# Patient Record
Sex: Female | Born: 1970 | Race: White | Hispanic: No | Marital: Married | Smoking: Never smoker
Health system: Southern US, Community
[De-identification: ages and names within clinical notes are randomized; demographics above are authoritative.]

## PROBLEM LIST (undated history)

## (undated) DIAGNOSIS — E079 Disorder of thyroid, unspecified: Secondary | ICD-10-CM

## (undated) DIAGNOSIS — F410 Panic disorder [episodic paroxysmal anxiety] without agoraphobia: Secondary | ICD-10-CM

## (undated) DIAGNOSIS — K589 Irritable bowel syndrome without diarrhea: Secondary | ICD-10-CM

## (undated) DIAGNOSIS — F419 Anxiety disorder, unspecified: Secondary | ICD-10-CM

## (undated) DIAGNOSIS — N809 Endometriosis, unspecified: Secondary | ICD-10-CM

## (undated) DIAGNOSIS — N3010 Interstitial cystitis (chronic) without hematuria: Secondary | ICD-10-CM

## (undated) DIAGNOSIS — K219 Gastro-esophageal reflux disease without esophagitis: Secondary | ICD-10-CM

## (undated) DIAGNOSIS — M797 Fibromyalgia: Secondary | ICD-10-CM

## (undated) HISTORY — DX: Disorder of thyroid, unspecified: E07.9

## (undated) HISTORY — DX: Morbid (severe) obesity due to excess calories: E66.01

## (undated) HISTORY — DX: Fibromyalgia: M79.7

## (undated) HISTORY — DX: Panic disorder (episodic paroxysmal anxiety): F41.0

## (undated) HISTORY — DX: Anxiety disorder, unspecified: F41.9

## (undated) HISTORY — DX: Interstitial cystitis (chronic) without hematuria: N30.10

## (undated) HISTORY — DX: Irritable bowel syndrome, unspecified: K58.9

## (undated) HISTORY — DX: Gastro-esophageal reflux disease without esophagitis: K21.9

## (undated) HISTORY — PX: ENDOMETRIAL ABLATION: SHX621

## (undated) HISTORY — DX: Endometriosis, unspecified: N80.9

---

## 1997-04-20 ENCOUNTER — Observation Stay (HOSPITAL_COMMUNITY): Admission: AD | Admit: 1997-04-20 | Discharge: 1997-04-20 | Payer: Self-pay | Admitting: Family Medicine

## 1997-04-25 ENCOUNTER — Observation Stay (HOSPITAL_COMMUNITY): Admission: AD | Admit: 1997-04-25 | Discharge: 1997-04-26 | Payer: Self-pay | Admitting: Family Medicine

## 1999-03-07 ENCOUNTER — Other Ambulatory Visit: Admission: RE | Admit: 1999-03-07 | Discharge: 1999-03-07 | Payer: Self-pay | Admitting: Obstetrics & Gynecology

## 1999-09-01 ENCOUNTER — Ambulatory Visit (HOSPITAL_COMMUNITY): Admission: RE | Admit: 1999-09-01 | Discharge: 1999-09-01 | Payer: Self-pay | Admitting: Obstetrics & Gynecology

## 2000-03-05 ENCOUNTER — Other Ambulatory Visit: Admission: RE | Admit: 2000-03-05 | Discharge: 2000-03-05 | Payer: Self-pay | Admitting: Obstetrics & Gynecology

## 2001-04-09 ENCOUNTER — Other Ambulatory Visit: Admission: RE | Admit: 2001-04-09 | Discharge: 2001-04-09 | Payer: Self-pay | Admitting: Obstetrics & Gynecology

## 2002-06-07 ENCOUNTER — Inpatient Hospital Stay (HOSPITAL_COMMUNITY): Admission: AD | Admit: 2002-06-07 | Discharge: 2002-06-07 | Payer: Self-pay | Admitting: Obstetrics & Gynecology

## 2002-06-16 ENCOUNTER — Inpatient Hospital Stay (HOSPITAL_COMMUNITY): Admission: AD | Admit: 2002-06-16 | Discharge: 2002-06-18 | Payer: Self-pay | Admitting: Obstetrics and Gynecology

## 2002-07-15 ENCOUNTER — Other Ambulatory Visit: Admission: RE | Admit: 2002-07-15 | Discharge: 2002-07-15 | Payer: Self-pay | Admitting: Obstetrics & Gynecology

## 2003-09-22 ENCOUNTER — Other Ambulatory Visit: Admission: RE | Admit: 2003-09-22 | Discharge: 2003-09-22 | Payer: Self-pay | Admitting: Obstetrics & Gynecology

## 2004-04-08 ENCOUNTER — Emergency Department (HOSPITAL_COMMUNITY): Admission: EM | Admit: 2004-04-08 | Discharge: 2004-04-08 | Payer: Self-pay | Admitting: Emergency Medicine

## 2004-11-27 ENCOUNTER — Other Ambulatory Visit: Admission: RE | Admit: 2004-11-27 | Discharge: 2004-11-27 | Payer: Self-pay | Admitting: Obstetrics & Gynecology

## 2009-07-19 ENCOUNTER — Encounter: Admission: RE | Admit: 2009-07-19 | Discharge: 2009-07-19 | Payer: Self-pay | Admitting: Obstetrics & Gynecology

## 2009-11-20 ENCOUNTER — Emergency Department (HOSPITAL_BASED_OUTPATIENT_CLINIC_OR_DEPARTMENT_OTHER): Admission: EM | Admit: 2009-11-20 | Discharge: 2009-11-21 | Payer: Self-pay | Admitting: Emergency Medicine

## 2009-11-21 ENCOUNTER — Ambulatory Visit: Payer: Self-pay | Admitting: Diagnostic Radiology

## 2010-07-20 NOTE — Op Note (Signed)
[facility]  Patient:    Adrienne Summers, Adrienne Summers                    MRN: [id number] Proc. Date: 09/01/99 Adm. Date:  [id number] Attending:  Minette Headland                           Operative Report  PREOPERATIVE DIAGNOSES:       Chronic pelvic pain, probable recurrent                               endometriosis.  POSTOPERATIVE DIAGNOSES:      Recurrent endometriosis and pelvic adhesions.  OPERATIVE PROCEDURE:          Lysis of pelvic adhesions, fulguration of endometriosis.  SURGEON:                      Freddy Finner, M.D.  ANESTHESIA:                   General endotracheal.  INTRAOPERATIVE COMPLICATIONS: None.  ESTIMATED BLOOD LOSS:         Less than or equal to 10 cc.  INDICATIONS:                  The patient is a 40 year old who was found to have pelvic endometriosis treated by laser in 1997.  She had urterosacral nerve ablation at time. Over the past six to 12 months, she has had recurrence of her symptoms.  She is now admitted for repeat laparoscopy.  FINDINGS:                     Adhesions from the right ovary to the right pelvic sidewall, also from the left ovary to the left pelvic sidewall, from the sigmoid colon to the left lateral pelvic peritoneum.  There was scattered endometriotic lesions, some probably in the base of the adhesions to the right pelvic sidewall, one along the left pelvic sidewall, and one along the uterosacral ligament, one in the cul-de-sac.  DESCRIPTION OF PROCEDURE:     The patient was admitted on the morning of surgery and brought to the operating room and placed under adequate general endotracheal anesthesia and placed in the dorsolithotomy position using Allen stirrups.  Hibiclens of the abdomen, perineum, and vagina was carried out. The bladder was evacuated with a Robinson catheter.  The cervix was visualized the Hulka tenaculum attached without difficulty.  Sterile drapes were applied. Two incisions  were made through all the scars, one at the umbilicus and just above the symphysis.  A 10 mm trocar was introduced through the upper incision incision while elevating the anterior abdominal wall manually.  Direct inspection revealed adequate placement and no evidence of injury on entry. Pneumoperitoneum was allowed to accumulate.  Appendix was visualized and was normal.  Pelvic contents were carefully inspected with findings as noted above.  Using the reticulated endoshears through the operating channel of the laparoscope, the adhesions were lysed.  The bipolar forceps were then used to fulgurate all visible evidence of endometriosis and fulgurate along the margins of the adhesion lysis on the right side.  There was also a lesion consistent with endometriosis on the right ovary which was fulguration. Irrigating solution was used, lactated Ringers through ___ probe. Irrigation was carried out and hemostasis was complete.  Irrigating  solution was aspirated, gas was allowed to escape from the abdomen.  All instruments were removed.  Marcaine 0.50% was injected in the incision sites for postoperative analgesia.  The incisions were closed with interrupted subcuticular sutures of 3-0 Dexon.  The patient tolerated the operative procedure well.  She has Darvocet she will take for postoperative pain.  She was taken to the recovery room in good condition and will be given routine outpatient surgical instructions. DD:  09/01/99 TD:  09/01/99 Job: [id number]

## 2010-11-06 ENCOUNTER — Other Ambulatory Visit: Payer: Self-pay | Admitting: Obstetrics & Gynecology

## 2010-11-06 DIAGNOSIS — N63 Unspecified lump in unspecified breast: Secondary | ICD-10-CM

## 2010-11-08 ENCOUNTER — Ambulatory Visit
Admission: RE | Admit: 2010-11-08 | Discharge: 2010-11-08 | Disposition: A | Discharge status: Home / Self Care | Payer: BC Managed Care – PPO | Source: Ambulatory Visit | Attending: Obstetrics & Gynecology | Admitting: Obstetrics & Gynecology

## 2010-11-08 DIAGNOSIS — N63 Unspecified lump in unspecified breast: Secondary | ICD-10-CM

## 2015-04-27 ENCOUNTER — Ambulatory Visit: Payer: Self-pay

## 2015-04-27 ENCOUNTER — Encounter: Payer: Self-pay | Admitting: Podiatry

## 2015-04-27 ENCOUNTER — Ambulatory Visit (INDEPENDENT_AMBULATORY_CARE_PROVIDER_SITE_OTHER): Payer: 59 | Admitting: Podiatry

## 2015-04-27 VITALS — BP 131/86 | HR 85 | Resp 16 | Ht 63.0 in | Wt 257.0 lb

## 2015-04-27 DIAGNOSIS — L600 Ingrowing nail: Secondary | ICD-10-CM

## 2015-04-27 DIAGNOSIS — M722 Plantar fascial fibromatosis: Secondary | ICD-10-CM | POA: Diagnosis not present

## 2015-04-27 MED ORDER — TRIAMCINOLONE ACETONIDE 10 MG/ML IJ SUSP
10.0000 mg | Freq: Once | INTRAMUSCULAR | Status: AC
  Administered 2015-04-27: 10 mg

## 2015-04-27 NOTE — Progress Notes (Signed)
   Subjective:    Patient ID: Adrienne Summers, female    DOB: [birth year], 45 y.o.   MRN: [id number]  HPI Patient presents with a nail problem on their Left foot; great toe-lateral; x1 month.   Review of Systems  Constitutional: Positive for fatigue.  HENT: Positive for sinus pressure.   All other systems reviewed and are negative.      Objective:   Physical Exam        Assessment & Plan:

## 2015-04-27 NOTE — Patient Instructions (Signed)

## 2015-04-28 NOTE — Progress Notes (Signed)
Subjective:     Patient ID: Adrienne Summers, female   DOB: [birth year], 45 y.o.   MRN: [id number]  HPI neurovascular status intact muscle strength adequate with patient found to have incurvated left hallux lateral border that's painful when pressed and moderate discomfort in the plantar heel. States the heels bothering her for a while and the nails been giving her trouble for around a month   Review of Systems  All other systems reviewed and are negative.      Objective:   Physical Exam  Constitutional: She is oriented to person, place, and time.  Cardiovascular: Intact distal pulses.   Musculoskeletal: Normal range of motion.  Neurological: She is oriented to person, place, and time.  Skin: Skin is warm.  Nursing note and vitals reviewed.  neurovascular status found to be intact muscle strength adequate range of motion within normal limits with patient noted to have incurvated left hallux lateral border that's painful when pressed with also patient noted to have a pain in the left heel at the insertional point tendon calcaneus. I noted distal redness in the toe and patient was not noted to have drainage and has good digital perfusion and is well oriented 3     Assessment:     Plantar fasciitis left with ingrown toenail deformity left hallux lateral border    Plan:     H&P and x-rays reviewed with patient. Today I went ahead and I recommended correction of the nail and I explained procedure and risk and patient wants procedure. I infiltrated the left hallux 60 mg Xylocaine Marcaine mixture remove the lateral border exposed matrix and applied phenol 3 applications 30 seconds followed by alcohol lavage and sterile dressing. Gave instructions on soaks and reappoint and also went ahead and injected the left plantar fashion 3 mg Kenalog 5 mg Xylocaine and instructed on physical therapy and reappoint 2 weeks  X-ray report indicated small spur with no indications of stress fracture or  arthritis

## 2015-05-04 ENCOUNTER — Telehealth: Payer: Self-pay | Admitting: *Deleted

## 2015-05-04 NOTE — Telephone Encounter (Signed)
Called patient at [phone] (Cell #) to check to see how they were doing from their ingrown toenail procedure that was performed on Thursday, April 27, 2015. Pt stated, "Doing good and has no pain'.

## 2015-05-11 ENCOUNTER — Ambulatory Visit (INDEPENDENT_AMBULATORY_CARE_PROVIDER_SITE_OTHER): Payer: 59 | Admitting: Podiatry

## 2015-05-11 DIAGNOSIS — M722 Plantar fascial fibromatosis: Secondary | ICD-10-CM

## 2015-05-13 NOTE — Progress Notes (Signed)
Subjective:     Patient ID: Adrienne Summers, female   DOB: 07-May-1970, 45 y.o.   MRN: [id number]  HPI patient presents stating I am doing a lot better with my pain   Review of Systems     Objective:   Physical Exam Neurovascular status intact muscle strength adequate with diminished discomfort plantar right foot with fluid still present upon deep palpation but improved on discomfort    Assessment:     Improving tendinitis-like condition right    Plan:     Discussed the importance of physical therapy anti-inflammatories supportive shoes and not going barefoot. If symptoms remain minimal patient will not be seen back and less needed

## 2015-07-24 ENCOUNTER — Ambulatory Visit: Payer: 59 | Admitting: Podiatry

## 2016-01-09 ENCOUNTER — Other Ambulatory Visit: Payer: Self-pay | Admitting: Obstetrics & Gynecology

## 2016-01-09 DIAGNOSIS — Z1231 Encounter for screening mammogram for malignant neoplasm of breast: Secondary | ICD-10-CM

## 2016-02-13 ENCOUNTER — Ambulatory Visit
Admission: RE | Admit: 2016-02-13 | Discharge: 2016-02-13 | Disposition: A | Discharge status: Home / Self Care | Payer: 59 | Source: Ambulatory Visit | Attending: Obstetrics & Gynecology | Admitting: Obstetrics & Gynecology

## 2016-02-13 DIAGNOSIS — Z1231 Encounter for screening mammogram for malignant neoplasm of breast: Secondary | ICD-10-CM

## 2016-02-15 ENCOUNTER — Other Ambulatory Visit: Payer: Self-pay | Admitting: Obstetrics & Gynecology

## 2016-02-15 DIAGNOSIS — R928 Other abnormal and inconclusive findings on diagnostic imaging of breast: Secondary | ICD-10-CM

## 2016-02-19 ENCOUNTER — Ambulatory Visit
Admission: RE | Admit: 2016-02-19 | Discharge: 2016-02-19 | Disposition: A | Discharge status: Home / Self Care | Payer: 59 | Source: Ambulatory Visit | Attending: Obstetrics & Gynecology | Admitting: Obstetrics & Gynecology

## 2016-02-19 DIAGNOSIS — R928 Other abnormal and inconclusive findings on diagnostic imaging of breast: Secondary | ICD-10-CM

## 2016-09-26 ENCOUNTER — Other Ambulatory Visit: Payer: Self-pay | Admitting: Obstetrics & Gynecology

## 2016-09-26 DIAGNOSIS — N6320 Unspecified lump in the left breast, unspecified quadrant: Secondary | ICD-10-CM

## 2016-10-02 ENCOUNTER — Other Ambulatory Visit: Payer: Self-pay | Admitting: Obstetrics & Gynecology

## 2016-10-02 ENCOUNTER — Ambulatory Visit
Admission: RE | Admit: 2016-10-02 | Discharge: 2016-10-02 | Disposition: A | Discharge status: Home / Self Care | Payer: 59 | Source: Ambulatory Visit | Attending: Obstetrics & Gynecology | Admitting: Obstetrics & Gynecology

## 2016-10-02 DIAGNOSIS — N6320 Unspecified lump in the left breast, unspecified quadrant: Secondary | ICD-10-CM

## 2016-10-02 DIAGNOSIS — N63 Unspecified lump in unspecified breast: Secondary | ICD-10-CM

## 2017-04-07 ENCOUNTER — Other Ambulatory Visit: Payer: 59

## 2017-04-23 ENCOUNTER — Ambulatory Visit: Payer: 59

## 2017-04-23 ENCOUNTER — Ambulatory Visit
Admission: RE | Admit: 2017-04-23 | Discharge: 2017-04-23 | Disposition: A | Discharge status: Home / Self Care | Payer: 59 | Source: Ambulatory Visit | Attending: Obstetrics & Gynecology | Admitting: Obstetrics & Gynecology

## 2017-04-23 DIAGNOSIS — N63 Unspecified lump in unspecified breast: Secondary | ICD-10-CM

## 2018-04-06 ENCOUNTER — Other Ambulatory Visit: Payer: Self-pay | Admitting: Obstetrics & Gynecology

## 2018-04-06 DIAGNOSIS — Z1231 Encounter for screening mammogram for malignant neoplasm of breast: Secondary | ICD-10-CM

## 2018-04-13 ENCOUNTER — Other Ambulatory Visit: Payer: Self-pay

## 2018-04-13 ENCOUNTER — Ambulatory Visit: Payer: 59 | Admitting: Family Medicine

## 2018-04-13 ENCOUNTER — Encounter: Payer: Self-pay | Admitting: Family Medicine

## 2018-04-13 VITALS — BP 130/88 | HR 99 | Temp 98.7°F | Resp 14 | Ht 63.0 in | Wt 255.0 lb

## 2018-04-13 DIAGNOSIS — M797 Fibromyalgia: Secondary | ICD-10-CM | POA: Diagnosis not present

## 2018-04-13 DIAGNOSIS — E038 Other specified hypothyroidism: Secondary | ICD-10-CM | POA: Insufficient documentation

## 2018-04-13 DIAGNOSIS — L719 Rosacea, unspecified: Secondary | ICD-10-CM

## 2018-04-13 DIAGNOSIS — K219 Gastro-esophageal reflux disease without esophagitis: Secondary | ICD-10-CM | POA: Diagnosis not present

## 2018-04-13 DIAGNOSIS — F410 Panic disorder [episodic paroxysmal anxiety] without agoraphobia: Secondary | ICD-10-CM

## 2018-04-13 DIAGNOSIS — K589 Irritable bowel syndrome without diarrhea: Secondary | ICD-10-CM | POA: Insufficient documentation

## 2018-04-13 DIAGNOSIS — E039 Hypothyroidism, unspecified: Secondary | ICD-10-CM | POA: Insufficient documentation

## 2018-04-13 DIAGNOSIS — N3010 Interstitial cystitis (chronic) without hematuria: Secondary | ICD-10-CM | POA: Insufficient documentation

## 2018-04-13 HISTORY — DX: Gastro-esophageal reflux disease without esophagitis: K21.9

## 2018-04-13 HISTORY — DX: Morbid (severe) obesity due to excess calories: E66.01

## 2018-04-13 HISTORY — DX: Interstitial cystitis (chronic) without hematuria: N30.10

## 2018-04-13 HISTORY — DX: Panic disorder (episodic paroxysmal anxiety): F41.0

## 2018-04-13 MED ORDER — SERTRALINE HCL 25 MG PO TABS: ORAL_TABLET | ORAL | 0 refills | Status: DC

## 2018-04-13 MED ORDER — METRONIDAZOLE 0.75 % EX CREA: TOPICAL_CREAM | Freq: Two times a day (BID) | CUTANEOUS | 0 refills | Status: DC

## 2018-04-13 NOTE — Patient Instructions (Signed)
Please return in 2-4 weeks for your annual complete physical; please come fasting.  I've restarted your zoloft.  I have sent in a cream for your face to use twice a day.   It was a pleasure meeting you today! Thank you for choosing [place] to meet your healthcare needs! I truly look forward to working with you. If you have any questions or concerns, please send me a message via Mychart or call the office at [phone].   Rosacea Rosacea is a long-term (chronic) condition that affects the skin of the face, including the cheeks, nose, forehead, and chin. This condition can also affect the eyes. Rosacea causes blood vessels near the surface of the skin to enlarge, which results in redness. What are the causes? The cause of this condition is not known. Certain triggers can make rosacea worse, including:  Hot baths.  Exercise.  Sunlight.  Very hot or cold temperatures.  Hot or spicy foods and drinks.  Drinking alcohol.  Stress.  Taking blood pressure medicine.  Long-term use of topical steroids on the face. What increases the risk? You are more likely to develop this condition if you:  Are older than 46 years of age.  Are a woman.  Have light-colored skin (light complexion).  Have a family history of rosacea. What are the signs or symptoms? Symptoms of this condition include:  Redness of the face.  Red bumps or pimples on the face.  A red, enlarged nose.  Blushing easily.  Red lines on the skin.  Irritated, burning, or itchy feeling in the eyes.  Swollen eyelids.  Drainage from the eyes.  Feeling like there is something in your eye. How is this diagnosed? This condition is diagnosed with a medical history and physical exam. How is this treated? There is no cure for this condition, but treatment can help to control your symptoms. Your health care provider may recommend that you see a skin specialist (dermatologist). Treatment may include:  Medicines that are  applied to the skin or taken by mouth (orally). This can include antibiotic medicines.  Laser treatment to improve the appearance of the skin.  Surgery. This is rare. Your health care provider will also recommend the best way to take care of your skin. Even after your skin improves, you will likely need to continue treatment to prevent your rosacea from coming back. Follow these instructions at home: Skin care Take care of your skin as told by your health care provider. You may be told to do these things:  Wash your skin gently two or more times each day.  Use mild soap.  Use a sunscreen or sunblock with SPF 30 or greater.  Use gentle cosmetics that are meant for sensitive skin.  Shave with an electric shaver instead of a blade. Lifestyle  Try to keep track of what foods trigger this condition. Avoid any triggers. These may include: ? Spicy foods. ? Seafood. ? Cheese. ? Hot liquids. ? Nuts. ? Chocolate. ? Iodized salt.  Do not drink alcohol.  Avoid extremely cold or hot temperatures.  Try to reduce your stress. If you need help, talk with your health care provider.  When you exercise, do these things to stay cool: ? Limit sun exposure to your face. ? Use a fan. ? Do shorter and more frequent intervals of exercise. General instructions  Take and apply over-the-counter and prescription medicines only as told by your health care provider.  If you were prescribed an antibiotic medicine, apply it  or take it as told by your health care provider. Do not stop using the antibiotic even if your condition improves.  If your eyelids are affected, apply warm compresses to them. Do this as told by your health care provider.  Keep all follow-up visits as told by your health care provider. This is important. Contact a health care provider if:  Your symptoms get worse.  Your symptoms do not improve after 2 months of treatment.  You have new symptoms.  You have any changes in  vision or you have problems with your eyes, such as redness or itching.  You feel depressed.  You lose your appetite.  You have trouble concentrating. Summary  Rosacea is a long-term (chronic) condition that affects the skin of the face, including the cheeks, nose, forehead, and chin.  Take care of your skin as told by your health care provider.  Take and apply over-the-counter and prescription medicines only as told by your health care provider.  Contact a health care provider if your symptoms get worse or if you have any changes in vision or other problems with your eyes, such as redness or itching.  Keep all follow-up visits as told by your health care provider. This is important. This information is not intended to replace advice given to you by your health care provider. Make sure you discuss any questions you have with your health care provider. Document Released: 03/28/2004 Document Revised: 07/23/2017 Document Reviewed: 07/23/2017 Elsevier Interactive Patient Education  2019 [employer].

## 2018-04-13 NOTE — Progress Notes (Signed)
Subjective  CC:  Chief Complaint  Patient presents with  . Establish Care    No CPE for "a while" - has multiple "illnesses" through the years and would just like to get all on the same page.      HPI: Adrienne Summers is a 48 y.o. female who presents to [facility] today to establish care with me as a new patient.  48 yo with multiple medical problems reviewed and outlined below.  She has the following concerns or needs:  Fibromyalgia diagnosed by Dr. Phylliss Bob; failed cymbalta. Uses nsaids. Negative inflammatory arthropathy work up by pt report. Poor sleep  IC managed by Dr. Logan Bores  Long standing anxiety/panic managed with rare benzo but had been well maintained on zoloft 50 several years ago; she ran out of meds when her PCP retired. She reports she would like to restart meds.   IBS and GERD: has seen GI. Intermittent sxs.   Has a rash on her face x several years. ? Malar butterfly rash and wants to know if she needs to be checked for lupus. Denies new arthralgias, fevers. Reports chronic redness in the cheeks forehead and chin with intermittent papules. At times itches.   Assessment  1. Fibromyalgia   2. Interstitial cystitis   3. Gastroesophageal reflux disease without esophagitis   4. Panic disorder   5. Irritable bowel syndrome, unspecified type   6. Morbid obesity (HCC)   7. Rosacea      Plan   Reviewed mgt options for chronic conditions. Continue current meds but restart zoloft. Recheck 4 weeks  Rosacea: start creams and to derm if not improving. Doubt due to SLE. Reassured.   Needs CPE with pap and labs.   Follow up:  Return in about 4 weeks (around 05/11/2018) for complete physical. No orders of the defined types were placed in this encounter.  Meds ordered this encounter  Medications  . sertraline (ZOLOFT) 25 MG tablet    Sig: Take 0.5 tablets (12.5 mg total) by mouth daily for 14 days, THEN 1 tablet (25 mg total) daily for 14  days, THEN 2 tablets (50 mg total) daily for 14 days.    Dispense:  30 tablet    Refill:  0  . metroNIDAZOLE (METROCREAM) 0.75 % cream    Sig: Apply topically 2 (two) times daily.    Dispense:  45 g    Refill:  0     Depression screen PHQ 2/9 04/13/2018  Decreased Interest 0  Down, Depressed, Hopeless 0  PHQ - 2 Score 0    We updated and reviewed the patient's past history in detail and it is documented below.  Patient Active Problem List   Diagnosis Date Noted  . Fibromyalgia 04/13/2018    Dxd in 2001; evaluated by rheumatologist, Dr. Phylliss Bob; also has seen Dr. Corliss Skains but was not a good fit. Failed Cymbalta due to side effects   . Interstitial cystitis 04/13/2018    Managed by Dr. Logan Bores   . GERD (gastroesophageal reflux disease) 04/13/2018  . Subclinical hypothyroidism 04/13/2018  . Panic disorder 04/13/2018    Started in 1998; had been on low dose zoloft, and prn xanax   . IBS (irritable bowel syndrome) 04/13/2018  . Morbid obesity (HCC) 04/13/2018   Health Maintenance  Topic Date Due  . HIV Screening  12/16/1987  . TETANUS/TDAP  12/16/1991  . PAP SMEAR-Modifier  03/03/2017  . INFLUENZA VACCINE  06/02/2018 (Originally 10/02/2017)    There  is no immunization history on file for this patient. Current Meds  Medication Sig  . ALPRAZolam (XANAX) 0.25 MG tablet Take 0.25 mg by mouth as needed for anxiety.  . cetirizine (ZYRTEC) 10 MG tablet Take 10 mg by mouth daily.  Marland Kitchen. ibuprofen (ADVIL,MOTRIN) 200 MG tablet Take 200 mg by mouth as needed.  . [DISCONTINUED] fluticasone (FLONASE) 50 MCG/ACT nasal spray Place 2 sprays into both nostrils daily.    Allergies: Patient is allergic to codeine; tape; penicillins; sulfa antibiotics; and sulfamethoxazole. Past Medical History Patient  has a past medical history of Anxiety, Endometriosis, Fibromyalgia, GERD (gastroesophageal reflux disease) (04/13/2018), IBS (irritable bowel syndrome), Interstitial cystitis (04/13/2018), Morbid  obesity (HCC) (04/13/2018), Panic disorder (04/13/2018), and Thyroid disease. Past Surgical History Patient  has a past surgical history that includes Endometrial ablation. Family History: Patient family history includes Healthy in her daughter. Social History:  Patient  reports that she has never smoked. She has never used smokeless tobacco. She reports current alcohol use. She reports that she does not use drugs.  Review of Systems: Constitutional: negative for fever or malaise Ophthalmic: negative for photophobia, double vision or loss of vision Cardiovascular: negative for chest pain, dyspnea on exertion, or new LE swelling Respiratory: negative for SOB or persistent cough Gastrointestinal: negative for abdominal pain, change in bowel habits or melena Genitourinary: negative for dysuria or gross hematuria Musculoskeletal: negative for new gait disturbance or muscular weakness Integumentary: negative for new or persistent rashes Neurological: negative for TIA or stroke symptoms Psychiatric: negative for SI or delusions Allergic/Immunologic: negative for hives  Patient Care Team    Relationship Specialty Notifications Start End  Willow OraAndy, Cary Wilford L, MD PCP - General Family Medicine  04/13/18   Charna ElizabethMann, Jyothi, MD Consulting Physician Gastroenterology  04/13/18     Objective  Vitals: BP 130/88   Pulse 99   Temp 98.7 F (37.1 C) (Oral)   Resp 14   Ht 5\' 3"  (1.6 m)   Wt 255 lb (115.7 kg)   SpO2 97%   BMI 45.17 kg/m  General:  Well developed, well nourished, no acute distress  Psych:  Alert and oriented,normal mood and affect HEENT:  Normocephalic, atraumatic, non-icteric sclera, PERRL, oropharynx is without mass or exudate, supple neck without adenopathy, mass or thyromegaly Cardiovascular:  RRR without gallop, rub or murmur, nondisplaced PMI Respiratory:  Good breath sounds bilaterally, CTAB with normal respiratory effort Gastrointestinal: normal bowel sounds, soft, non-tender, no  noted masses. No HSM MSK: no deformities, contusions. Joints are without erythema or swelling Skin:  Warm, erythematous rash on face: cheeks, chins and forehead with acneiform papules.  Neurologic:    Mental status is normal. Gross motor and sensory exams are normal. Normal gait   Commons side effects, risks, benefits, and alternatives for medications and treatment plan prescribed today were discussed, and the patient expressed understanding of the given instructions. Patient is instructed to call or message via MyChart if he/she has any questions or concerns regarding our treatment plan. No barriers to understanding were identified. We discussed Red Flag symptoms and signs in detail. Patient expressed understanding regarding what to do in case of urgent or emergency type symptoms.   Medication list was reconciled, printed and provided to the patient in AVS. Patient instructions and summary information was reviewed with the patient as documented in the AVS. This note was prepared with assistance of Dragon voice recognition software. Occasional wrong-word or sound-a-like substitutions may have occurred due to the inherent limitations of voice recognition software

## 2018-04-15 ENCOUNTER — Encounter: Payer: Self-pay | Admitting: *Deleted

## 2018-05-04 ENCOUNTER — Other Ambulatory Visit: Payer: Self-pay | Admitting: Family Medicine

## 2018-05-05 ENCOUNTER — Ambulatory Visit: Payer: 59

## 2018-05-05 MED ORDER — SERTRALINE HCL 50 MG PO TABS: 50.0000 mg | ORAL_TABLET | Freq: Every day | ORAL | 0 refills | Status: DC

## 2018-06-02 ENCOUNTER — Ambulatory Visit: Payer: 59

## 2018-06-04 ENCOUNTER — Encounter: Payer: 59 | Admitting: Family Medicine

## 2018-08-05 ENCOUNTER — Other Ambulatory Visit: Payer: Self-pay | Admitting: Family Medicine

## 2018-08-05 NOTE — Telephone Encounter (Signed)
Patient is overdue for f/u mood disorder. Please call to schedule. Refilled sertraline for 30 days only. No further refills w/o ov, virtual is fine.

## 2018-08-05 NOTE — Telephone Encounter (Signed)
Left a voicemail for the patient to call back to schedule appointment. No need to route message, for documentation only.

## 2018-08-19 ENCOUNTER — Encounter: Payer: 59 | Admitting: Family Medicine

## 2018-09-03 ENCOUNTER — Other Ambulatory Visit: Payer: Self-pay | Admitting: Family Medicine

## 2018-10-13 ENCOUNTER — Encounter: Payer: 59 | Admitting: Family Medicine

## 2018-10-13 DIAGNOSIS — Z0289 Encounter for other administrative examinations: Secondary | ICD-10-CM

## 2018-10-20 ENCOUNTER — Encounter: Payer: Self-pay | Admitting: Family Medicine

## 2019-03-31 ENCOUNTER — Other Ambulatory Visit: Payer: Self-pay | Admitting: Obstetrics & Gynecology

## 2019-03-31 DIAGNOSIS — N6002 Solitary cyst of left breast: Secondary | ICD-10-CM

## 2019-04-01 ENCOUNTER — Ambulatory Visit
Admission: RE | Admit: 2019-04-01 | Discharge: 2019-04-01 | Disposition: A | Discharge status: Home / Self Care | Payer: 59 | Source: Ambulatory Visit | Attending: Obstetrics & Gynecology | Admitting: Obstetrics & Gynecology

## 2019-04-01 ENCOUNTER — Other Ambulatory Visit: Payer: Self-pay

## 2019-04-01 ENCOUNTER — Other Ambulatory Visit: Payer: Self-pay | Admitting: Obstetrics & Gynecology

## 2019-04-01 DIAGNOSIS — N6002 Solitary cyst of left breast: Secondary | ICD-10-CM

## 2019-04-01 LAB — HM PAP SMEAR: HM Pap smear: NEGATIVE

## 2019-04-01 LAB — HM MAMMOGRAPHY

## 2019-04-08 ENCOUNTER — Encounter: Payer: Self-pay | Admitting: Family Medicine

## 2019-04-09 ENCOUNTER — Ambulatory Visit: Payer: 59

## 2019-04-28 ENCOUNTER — Encounter: Payer: Self-pay | Admitting: Family Medicine

## 2019-04-28 ENCOUNTER — Ambulatory Visit (INDEPENDENT_AMBULATORY_CARE_PROVIDER_SITE_OTHER): Payer: 59 | Admitting: Family Medicine

## 2019-04-28 ENCOUNTER — Other Ambulatory Visit: Payer: Self-pay

## 2019-04-28 VITALS — BP 118/84 | HR 69 | Temp 97.9°F | Ht 63.0 in | Wt 257.6 lb

## 2019-04-28 DIAGNOSIS — N6012 Diffuse cystic mastopathy of left breast: Secondary | ICD-10-CM

## 2019-04-28 DIAGNOSIS — Z0000 Encounter for general adult medical examination without abnormal findings: Secondary | ICD-10-CM

## 2019-04-28 DIAGNOSIS — N3010 Interstitial cystitis (chronic) without hematuria: Secondary | ICD-10-CM

## 2019-04-28 DIAGNOSIS — K589 Irritable bowel syndrome without diarrhea: Secondary | ICD-10-CM

## 2019-04-28 DIAGNOSIS — E039 Hypothyroidism, unspecified: Secondary | ICD-10-CM | POA: Diagnosis not present

## 2019-04-28 DIAGNOSIS — N6011 Diffuse cystic mastopathy of right breast: Secondary | ICD-10-CM

## 2019-04-28 DIAGNOSIS — K219 Gastro-esophageal reflux disease without esophagitis: Secondary | ICD-10-CM | POA: Diagnosis not present

## 2019-04-28 DIAGNOSIS — L719 Rosacea, unspecified: Secondary | ICD-10-CM | POA: Insufficient documentation

## 2019-04-28 DIAGNOSIS — Z23 Encounter for immunization: Secondary | ICD-10-CM | POA: Diagnosis not present

## 2019-04-28 DIAGNOSIS — M797 Fibromyalgia: Secondary | ICD-10-CM

## 2019-04-28 DIAGNOSIS — F410 Panic disorder [episodic paroxysmal anxiety] without agoraphobia: Secondary | ICD-10-CM

## 2019-04-28 DIAGNOSIS — E038 Other specified hypothyroidism: Secondary | ICD-10-CM

## 2019-04-28 LAB — COMPREHENSIVE METABOLIC PANEL
ALT: 13 U/L (ref 0–35)
AST: 13 U/L (ref 0–37)
Albumin: 3.7 g/dL (ref 3.5–5.2)
Alkaline Phosphatase: 104 U/L (ref 39–117)
BUN: 13 mg/dL (ref 6–23)
CO2: 23 mEq/L (ref 19–32)
Calcium: 9.1 mg/dL (ref 8.4–10.5)
Chloride: 107 mEq/L (ref 96–112)
Creatinine, Ser: 0.68 mg/dL (ref 0.40–1.20)
GFR: 93 mL/min (ref >60.00)
Glucose, Bld: 93 mg/dL (ref 70–99)
Potassium: 3.7 mEq/L (ref 3.5–5.1)
Sodium: 138 mEq/L (ref 135–145)
Total Bilirubin: 0.4 mg/dL (ref 0.2–1.2)
Total Protein: 6.7 g/dL (ref 6.0–8.3)

## 2019-04-28 LAB — CBC WITH DIFFERENTIAL/PLATELET
Basophils Absolute: 0.1 10*3/uL (ref 0.0–0.1)
Basophils Relative: 1 % (ref 0.0–3.0)
Eosinophils Absolute: 0.3 10*3/uL (ref 0.0–0.7)
Eosinophils Relative: 4.8 % (ref 0.0–5.0)
HCT: 38.1 % (ref 36.0–46.0)
Hemoglobin: 12.9 g/dL (ref 12.0–15.0)
Lymphocytes Relative: 23 % (ref 12.0–46.0)
Lymphs Abs: 1.3 10*3/uL (ref 0.7–4.0)
MCHC: 33.9 g/dL (ref 30.0–36.0)
MCV: 88.7 fl (ref 78.0–100.0)
Monocytes Absolute: 0.3 10*3/uL (ref 0.1–1.0)
Monocytes Relative: 5.8 % (ref 3.0–12.0)
Neutro Abs: 3.6 10*3/uL (ref 1.4–7.7)
Neutrophils Relative %: 65.4 % (ref 43.0–77.0)
Platelets: 343 10*3/uL (ref 150.0–400.0)
RBC: 4.29 Mil/uL (ref 3.87–5.11)
RDW: 13.7 % (ref 11.5–15.5)
WBC: 5.5 10*3/uL (ref 4.0–10.5)

## 2019-04-28 LAB — LIPID PANEL
Cholesterol: 207 mg/dL — ABNORMAL HIGH (ref 0–200)
HDL: 50.4 mg/dL (ref >39.00)
LDL Cholesterol: 142 mg/dL — ABNORMAL HIGH (ref 0–99)
NonHDL: 156.77
Total CHOL/HDL Ratio: 4
Triglycerides: 72 mg/dL (ref 0.0–149.0)
VLDL: 14.4 mg/dL (ref 0.0–40.0)

## 2019-04-28 LAB — SEDIMENTATION RATE: Sed Rate: 49 mm/hr — ABNORMAL HIGH (ref 0–20)

## 2019-04-28 LAB — TSH: TSH: 4.7 u[IU]/mL — ABNORMAL HIGH (ref 0.35–4.50)

## 2019-04-28 LAB — T4, FREE: Free T4: 0.71 ng/dL (ref 0.60–1.60)

## 2019-04-28 NOTE — Patient Instructions (Addendum)
Please return in 12 months for your annual complete physical; please come fasting.  I will release your lab results to you on your MyChart account with further instructions. Please reply with any questions.   Today you were given your Tdap vaccination. This is good for 10 years.   We will call you with information regarding your referral appointment. Dermatology to evaluate your red rash on your face. If you do not hear from [place] within the next 2 weeks, please let me know. It can take 1-2 weeks to get appointments set up with the specialists.   If you have any questions or concerns, please don't hesitate to send me a message via MyChart or call the office at [phone]. Thank you for visiting with [place] today! It's our pleasure caring for you.  Please do these things to maintain good health!   Exercise at least 30-45 minutes a day,  4-5 days a week.   Eat a low-fat diet with lots of fruits and vegetables, up to 7-9 servings per day.  Drink plenty of water daily. Try to drink 8 8oz glasses per day.  Seatbelts can save your life. Always wear your seatbelt.  Place Smoke Detectors on every level of your home and check batteries every year.  Schedule an appointment with an eye doctor for an eye exam every 1-2 years  Safe sex - use condoms to protect yourself from STDs if you could be exposed to these types of infections. Use birth control if you do not want to become pregnant and are sexually active.  Avoid heavy alcohol use. If you drink, keep it to less than 2 drinks/day and not every day.  Health Care Power of Attorney.  Choose someone you trust that could speak for you if you became unable to speak for yourself.  Depression is common in our stressful world.If you're feeling down or losing interest in things you normally enjoy, please come in for a visit.  If anyone is threatening or hurting you, please get help. Physical or Emotional Violence is never OK.    Fat and Cholesterol  Restricted Eating Plan Getting too much fat and cholesterol in your diet may cause health problems. Choosing the right foods helps keep your fat and cholesterol at normal levels. This can keep you from getting certain diseases. Your doctor may recommend an eating plan that includes:  Total fat: ______% or less of total calories a day.  Saturated fat: ______% or less of total calories a day.  Cholesterol: less than _________mg a day.  Fiber: ______g a day. What are tips for following this plan? Meal planning  At meals, divide your plate into four equal parts: ? Fill one-half of your plate with vegetables and green salads. ? Fill one-fourth of your plate with whole grains. ? Fill one-fourth of your plate with low-fat (lean) protein foods.  Eat fish that is high in omega-3 fats at least two times a week. This includes mackerel, tuna, sardines, and salmon.  Eat foods that are high in fiber, such as whole grains, beans, apples, broccoli, carrots, peas, and barley. General tips   Work with your doctor to lose weight if you need to.  Avoid: ? Foods with added sugar. ? Fried foods. ? Foods with partially hydrogenated oils.  Limit alcohol intake to no more than 1 drink a day for nonpregnant women and 2 drinks a day for men. One drink equals 12 oz of beer, 5 oz of wine, or 1 oz of  hard liquor. Reading food labels  Check food labels for: ? Trans fats. ? Partially hydrogenated oils. ? Saturated fat (g) in each serving. ? Cholesterol (mg) in each serving. ? Fiber (g) in each serving.  Choose foods with healthy fats, such as: ? Monounsaturated fats. ? Polyunsaturated fats. ? Omega-3 fats.  Choose grain products that have whole grains. Look for the word "whole" as the first word in the ingredient list. Cooking  Cook foods using low-fat methods. These include baking, boiling, grilling, and broiling.  Eat more home-cooked foods. Eat at restaurants and buffets less often.  Avoid  cooking using saturated fats, such as butter, cream, palm oil, palm kernel oil, and coconut oil. Recommended foods  Fruits  All fresh, canned (in natural juice), or frozen fruits. Vegetables  Fresh or frozen vegetables (raw, steamed, roasted, or grilled). Green salads. Grains  Whole grains, such as whole wheat or whole grain breads, crackers, cereals, and pasta. Unsweetened oatmeal, bulgur, barley, quinoa, or brown rice. Corn or whole wheat flour tortillas. Meats and other protein foods  Ground beef (85% or leaner), grass-fed beef, or beef trimmed of fat. Skinless chicken or [place]. Ground chicken or [place]. Pork trimmed of fat. All fish and seafood. Egg whites. Dried beans, peas, or lentils. Unsalted nuts or seeds. Unsalted canned beans. Nut butters without added sugar or oil. Dairy  Low-fat or nonfat dairy products, such as skim or 1% milk, 2% or reduced-fat cheeses, low-fat and fat-free ricotta or cottage cheese, or plain low-fat and nonfat yogurt. Fats and oils  Tub margarine without trans fats. Light or reduced-fat mayonnaise and salad dressings. Avocado. Olive, canola, sesame, or safflower oils. The items listed above may not be a complete list of foods and beverages you can eat. Contact a dietitian for more information. Foods to avoid Fruits  Canned fruit in heavy syrup. Fruit in cream or butter sauce. Fried fruit. Vegetables  Vegetables cooked in cheese, cream, or butter sauce. Fried vegetables. Grains  White bread. White pasta. White rice. Cornbread. Bagels, pastries, and croissants. Crackers and snack foods that contain trans fat and hydrogenated oils. Meats and other protein foods  Fatty cuts of meat. Ribs, chicken wings, bacon, sausage, bologna, salami, chitterlings, fatback, hot dogs, bratwurst, and packaged lunch meats. Liver and organ meats. Whole eggs and egg yolks. Chicken and [place] with skin. Fried meat. Dairy  Whole or 2% milk, cream, half-and-half, and cream  cheese. Whole milk cheeses. Whole-fat or sweetened yogurt. Full-fat cheeses. Nondairy creamers and whipped toppings. Processed cheese, cheese spreads, and cheese curds. Beverages  Alcohol. Sugar-sweetened drinks such as sodas, lemonade, and fruit drinks. Fats and oils  Butter, stick margarine, lard, shortening, ghee, or bacon fat. Coconut, palm kernel, and palm oils. Sweets and desserts  Corn syrup, sugars, honey, and molasses. Candy. Jam and jelly. Syrup. Sweetened cereals. Cookies, pies, cakes, donuts, muffins, and ice cream. The items listed above may not be a complete list of foods and beverages you should avoid. Contact a dietitian for more information. Summary  Choosing the right foods helps keep your fat and cholesterol at normal levels. This can keep you from getting certain diseases.  At meals, fill one-half of your plate with vegetables and green salads.  Eat high-fiber foods, like whole grains, beans, apples, carrots, peas, and barley.  Limit added sugar, saturated fats, alcohol, and fried foods. This information is not intended to replace advice given to you by your health care provider. Make sure you discuss any questions you have with your  health care provider. Document Revised: 10/22/2017 Document Reviewed: 11/05/2016 Elsevier Patient Education  [place].

## 2019-04-28 NOTE — Progress Notes (Signed)
Subjective  Chief Complaint  Patient presents with  . Annual Exam    fasting  . Hypothyroidism    Takes Synthroid, no side effects  . Panic disorder    takes Xanax and Zoloft.     HPI: Adrienne Summers is a 49 y.o. female who presents to [facility] [place] today for a Female Wellness Visit. She also has the concerns and/or needs as listed above in the chief complaint. These will be addressed in addition to the Health Maintenance Visit.   Wellness Visit: annual visit with health maintenance review and exam without Pap   HM: up to date by report. Saw GYN last month. Nl pap. Requesting records. mammo and ultrasound showed bilateral cyst. Due tdap and flu. Declines flu vaccine  Morbid obesity: weight is stable.  Chronic disease f/u and/or acute problem visit: (deemed necessary to be done in addition to the wellness visit):  Anxiety: restarted zoloft last year for anxiety and has done well. Reports mood and stress levels are now again well controlled.   H/o subclinical hypothyroidism: due for recheck. Has fatigue but mostly relates this to her fibromyalgia. No sob, leg swelling, skin changes or hair changes. No heat or cold intolerances  IC and fibro: stable by reports. Chronic aches and pains treated w/ otc medications  IBS and GERD: stable. No chronic meds. No melena.  Red rash on face x years. Thought to be rosacea at last visit and metrogel started.pt reports no change in rash.   Assessment  1. Annual physical exam   2. Fibromyalgia   3. Interstitial cystitis   4. Subclinical hypothyroidism   5. Morbid obesity (HCC)   6. Irritable bowel syndrome, unspecified type   7. Gastroesophageal reflux disease without esophagitis   8. Panic disorder   9. Rosacea   10. Fibrocystic breast changes, bilateral      Plan  Female Wellness Visit:  Age appropriate Health Maintenance and Prevention measures were discussed with patient. Included topics are cancer  screening recommendations, ways to keep healthy (see AVS) including dietary and exercise recommendations, regular eye and dental care, use of seat belts, and avoidance of moderate alcohol use and tobacco use. Screens are up to date. Will get pap report  BMI: discussed patient's BMI and encouraged positive lifestyle modifications to help get to or maintain a target BMI.  HM needs and immunizations were addressed and ordered. See below for orders. See HM and immunization section for updates. tdap today  Routine labs and screening tests ordered including cmp, cbc and lipids where appropriate.  Discussed recommendations regarding Vit D and calcium supplementation (see AVS)  Chronic disease management visit and/or acute problem visit:  Fibro and IC: stable   Subclinical hypothyroidism: recheck levels today  Obesity: check lipids and fasting glucose. rec weight loss. See avs  IBS and gerd are well controlled.   Rash:? Rosacea. Refer to derm.   Panic/anxiety: now well controlled on zoloft. rec long term use.   Follow up: Return in about 1 year (around 04/27/2020) for mood follow up, complete physical.  Orders Placed This Encounter  Procedures  . CBC with Differential/Platelet  . Comprehensive metabolic panel  . Lipid panel  . TSH  . Sedimentation rate  . T4, free  . T3  . HIV Antibody (routine testing w rflx)  . Ambulatory referral to Dermatology   No orders of the defined types were placed in this encounter.     Lifestyle: Body mass index is  45.63 kg/m. Wt Readings from Last 3 Encounters:  04/28/19 257 lb 9.6 oz (116.8 kg)  04/13/18 255 lb (115.7 kg)  04/27/15 257 lb (116.6 kg)     Patient Active Problem List   Diagnosis Date Noted  . Rosacea 04/28/2019  . Fibrocystic breast changes, bilateral 04/28/2019  . Fibromyalgia 04/13/2018    Dxd in 2001; evaluated by rheumatologist, Dr. Phylliss Bob; also has seen Dr. Corliss Skains but was not a good fit. Failed Cymbalta due to side  effects   . Interstitial cystitis 04/13/2018    Managed by Dr. Logan Bores   . GERD (gastroesophageal reflux disease) 04/13/2018  . Subclinical hypothyroidism 04/13/2018  . Panic disorder 04/13/2018    Started in 1998; had been on low dose zoloft, and prn xanax   . IBS (irritable bowel syndrome) 04/13/2018  . Morbid obesity (HCC) 04/13/2018   Health Maintenance  Topic Date Due  . HIV Screening  12/16/1987  . TETANUS/TDAP  12/16/1991  . INFLUENZA VACCINE  06/02/2019 (Originally 10/03/2018)  . MAMMOGRAM  03/31/2021  . PAP SMEAR-Modifier  03/31/2022    There is no immunization history on file for this patient. We updated and reviewed the patient's past history in detail and it is documented below. Allergies: Patient is allergic to codeine; tape; penicillins; sulfa antibiotics; and sulfamethoxazole. Past Medical History Patient  has a past medical history of Anxiety, Endometriosis, Fibromyalgia, GERD (gastroesophageal reflux disease) (04/13/2018), IBS (irritable bowel syndrome), Interstitial cystitis (04/13/2018), Morbid obesity (HCC) (04/13/2018), Panic disorder (04/13/2018), and Thyroid disease. Past Surgical History Patient  has a past surgical history that includes Endometrial ablation. Family History: Patient family history includes Cancer in her brother, maternal grandmother, mother, and paternal grandmother; Healthy in her daughter; Hearing loss in her father and mother; Heart attack in her maternal grandfather and maternal grandmother; High Cholesterol in her father, mother, paternal grandfather, and paternal grandmother; High blood pressure in her maternal grandmother and mother. Social History:  Patient  reports that she has never smoked. She has never used smokeless tobacco. She reports current alcohol use. She reports that she does not use drugs.  Review of Systems: Constitutional: negative for fever or malaise Ophthalmic: negative for photophobia, double vision or loss of  vision Cardiovascular: negative for chest pain, dyspnea on exertion, or new LE swelling Respiratory: negative for SOB or persistent cough Gastrointestinal: negative for abdominal pain, change in bowel habits or melena Genitourinary: negative for dysuria or gross hematuria, no abnormal uterine bleeding or disharge Musculoskeletal: negative for new gait disturbance or muscular weakness Integumentary: negative for new or persistent rashes, no breast lumps Neurological: negative for TIA or stroke symptoms Psychiatric: negative for SI or delusions Allergic/Immunologic: negative for hives  Patient Care Team    Relationship Specialty Notifications Start End  Willow Ora, MD PCP - General Family Medicine  04/13/18   Charna Elizabeth, MD Consulting Physician Gastroenterology  04/13/18   Freddy Finner, MD Consulting Physician Obstetrics and Gynecology  04/28/19     Objective  Vitals: BP 118/84 (BP Location: Right Arm, Patient Position: Sitting, Cuff Size: Large)   Pulse 69   Temp 97.9 F (36.6 C) (Temporal)   Ht 5\' 3"  (1.6 m)   Wt 257 lb 9.6 oz (116.8 kg)   SpO2 97%   BMI 45.63 kg/m  General:  Well developed, well nourished, no acute distress  Psych:  Alert and orientedx3,normal mood and affect HEENT:  Normocephalic, atraumatic, non-icteric sclera, PERRL, oropharynx is clear without mass or exudate, supple neck without adenopathy, mass  or thyromegaly Cardiovascular:  Normal S1, S2, RRR without gallop, rub or murmur, nondisplaced PMI Respiratory:  Good breath sounds bilaterally, CTAB with normal respiratory effort Gastrointestinal: normal bowel sounds, soft, non-tender, no noted masses. No HSM MSK: no deformities, contusions. Joints are without erythema or swelling. Spine and CVA region are nontender Skin:  Warm, erythematous malar rash with some small pustules bilaterally Neurologic:    Mental status is normal. CN 2-11 are normal. Gross motor and sensory exams are normal. Normal gait. No  tremor    Commons side effects, risks, benefits, and alternatives for medications and treatment plan prescribed today were discussed, and the patient expressed understanding of the given instructions. Patient is instructed to call or message via MyChart if he/she has any questions or concerns regarding our treatment plan. No barriers to understanding were identified. We discussed Red Flag symptoms and signs in detail. Patient expressed understanding regarding what to do in case of urgent or emergency type symptoms.   Medication list was reconciled, printed and provided to the patient in AVS. Patient instructions and summary information was reviewed with the patient as documented in the AVS. This note was prepared with assistance of Dragon voice recognition software. Occasional wrong-word or sound-a-like substitutions may have occurred due to the inherent limitations of voice recognition software  This visit occurred during the SARS-CoV-2 public health emergency.  Safety protocols were in place, including screening questions prior to the visit, additional usage of staff PPE, and extensive cleaning of exam room while observing appropriate contact time as indicated for disinfecting solutions.

## 2019-04-28 NOTE — Addendum Note (Signed)
Addended byGracy Racer on: 04/28/2019 08:42 AM   Modules accepted: Orders

## 2019-04-29 LAB — HIV ANTIBODY (ROUTINE TESTING W REFLEX): HIV 1&2 Ab, 4th Generation: NONREACTIVE

## 2019-04-29 LAB — T3: T3, Total: 96 ng/dL (ref 76–181)

## 2019-05-25 ENCOUNTER — Encounter: Payer: Self-pay | Admitting: Family Medicine

## 2019-07-08 ENCOUNTER — Telehealth: Payer: Self-pay | Admitting: Family Medicine

## 2019-07-08 NOTE — Telephone Encounter (Signed)
Chief Complaint Cuts and Lacerations Reason for Call Symptomatic / Request for Health Information Initial Comment Office is transferring a pt. whose cat scratched her on Tuesday. Pt .of Asencion Partridge (not listed). Scratch is red and swollen. Translation No Nurse Assessment Nurse: Elroy Channel, RN, Rosey Bath Date/Time Lamount Cohen Time): 07/08/2019 9:41:48 AM Confirm and document reason for call. If symptomatic, describe symptoms. ---Caller states that her cat scratched her right hand. Area is red and swollen. Has the patient had close contact with a person known or suspected to have the novel coronavirus illness OR traveled / lives in area with major community spread (including international travel) in the last 14 days from the onset of symptoms? * If Asymptomatic, screen for exposure and travel within the last 14 days. ---No Does the patient have any new or worsening symptoms? ---Yes Will a triage be completed? ---Yes Related visit to physician within the last 2 weeks? ---No Does the PT have any chronic conditions? (i.e. diabetes, asthma, this includes High risk factors for pregnancy, etc.) ---Yes List chronic conditions. ---depression Is the patient pregnant or possibly pregnant? (Ask all females between the ages of 31-55) ---No Is this a behavioral health or substance abuse call? ---No Guidelines Guideline Title Affirmed Question Affirmed Notes Nurse Date/Time (Eastern Time) Cuts and Lacerations [1] Looks infected (spreading redness, pus) AND [2] no fever Elroy Channel, RN, Rosey Bath 07/08/2019 9:43:19 AM Disp. Time (Eastern Time) Disposition Final UserPLEASE NOTE: All timestamps contained within this report are represented as [place] Standard Time. CONFIDENTIALTY NOTICE: This fax transmission is intended only for the addressee. It contains information that is legally privileged, confidential or otherwise protected from use or disclosure. If you are not the intended recipient, you are strictly prohibited  from reviewing, disclosing, copying using or disseminating any of this information or taking any action in reliance on or regarding this information. If you have received this fax in error, please notify us immediately by telephone so that we can arrange for its return to [place]. Phone: [phone], Toll-Free: [phone], Fax: [phone] Page: 2 of 2 Call Id: [id number] 07/08/2019 9:44:28 AM See PCP within 24 Hours Yes Elroy Channel, RN, Suezanne Cheshire Disagree/Comply Comply Caller Understands Yes PreDisposition Call Doctor Care Advice Given Per Guideline SEE PCP WITHIN 24 HOURS: CLEANING AN INFECTED WOUND: * Wash the infected area with soap and water 3 times a day. ANTIBIOTIC OINTMENT: * Put a small amount of ANTIBIOTIC OINTMENT on the wound. Cover it with an adhesive bandage (Band-Aid) or dressing. Change daily or if it becomes wet. CALL BACK IF: * Fever occurs * You become worse. CARE ADVICE given per Cuts and Lacerations (Adult) guideline.

## 2019-07-08 NOTE — Telephone Encounter (Signed)
Can double book 11:30 today or make office visit 11:30 tomorrow.  Thanks

## 2019-07-08 NOTE — Telephone Encounter (Signed)
Patient has been schedule for 07/09/19 @ 11:30am

## 2019-07-08 NOTE — Telephone Encounter (Signed)
Patient called in had a cat scratch and it is red and swollen patient was triaged and was told to be seen in the next 24 hours. 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

## 2019-07-09 ENCOUNTER — Other Ambulatory Visit: Payer: Self-pay

## 2019-07-09 ENCOUNTER — Ambulatory Visit: Payer: 59 | Admitting: Family Medicine

## 2019-07-09 ENCOUNTER — Encounter: Payer: Self-pay | Admitting: Family Medicine

## 2019-07-09 VITALS — BP 124/82 | HR 89 | Temp 97.9°F | Resp 15 | Ht 63.0 in | Wt 257.0 lb

## 2019-07-09 DIAGNOSIS — W5503XA Scratched by cat, initial encounter: Secondary | ICD-10-CM | POA: Diagnosis not present

## 2019-07-09 DIAGNOSIS — L089 Local infection of the skin and subcutaneous tissue, unspecified: Secondary | ICD-10-CM | POA: Diagnosis not present

## 2019-07-09 DIAGNOSIS — S60511A Abrasion of right hand, initial encounter: Secondary | ICD-10-CM | POA: Diagnosis not present

## 2019-07-09 MED ORDER — AZITHROMYCIN 250 MG PO TABS: ORAL_TABLET | ORAL | 0 refills | Status: DC

## 2019-07-09 NOTE — Progress Notes (Signed)
Subjective  CC:  Chief Complaint  Patient presents with  . cat scratch    patients cat scratched right hand in two spots Tuesday night, red, swollen, warm to touch, states cat is UTD on all shots    HPI: Adrienne Summers is a 49 y.o. female who presents to the office today to address the problems listed above in the chief complaint.  70 yo with 2 cats, awoke to them fighting 3 nights ago; tried to pull them apart and got scratched on dorsal right hand x 2. Since the small wounds are red, swollen and draining. She denies chills, fevers, or swollen lymph nodes in that arm. Hand is sore especially if she types a lot during the day but otherwise feels fine.   Assessment  1. Cat scratch of hand, right, initial encounter   2. Infection of cat scratch wound      Plan   Infected cat scratch wounds w/o lymphadenitis:  Wound care, zpak, and monitor. Should do well.   Follow up: feb 2022 for cpe  Visit date not found  No orders of the defined types were placed in this encounter.  Meds ordered this encounter  Medications  . azithromycin (ZITHROMAX) 250 MG tablet    Sig: Take 2 tabs today, then 1 tab daily for 4 days    Dispense:  1 each    Refill:  0      I reviewed the patients updated PMH, FH, and SocHx.    Patient Active Problem List   Diagnosis Date Noted  . Rosacea 04/28/2019  . Fibrocystic breast changes, bilateral 04/28/2019  . Fibromyalgia 04/13/2018  . Interstitial cystitis 04/13/2018  . GERD (gastroesophageal reflux disease) 04/13/2018  . Subclinical hypothyroidism 04/13/2018  . Panic disorder 04/13/2018  . IBS (irritable bowel syndrome) 04/13/2018  . Morbid obesity ([place]) 04/13/2018   Current Meds  Medication Sig  . ALPRAZolam (XANAX) 0.25 MG tablet Take 0.25 mg by mouth as needed for anxiety.  . cetirizine (ZYRTEC) 10 MG tablet Take 10 mg by mouth daily.  Marland Kitchen ibuprofen (ADVIL,MOTRIN) 200 MG tablet Take 200 mg by mouth as needed.  . sertraline (ZOLOFT) 50 MG  tablet TAKE 1 TABLET BY MOUTH EVERY DAY    Allergies: Patient is allergic to codeine; tape; penicillins; sulfa antibiotics; and sulfamethoxazole. Family History: Patient family history includes Cancer in her brother, maternal grandmother, mother, and paternal grandmother; Healthy in her daughter; Hearing loss in her father and mother; Heart attack in her maternal grandfather and maternal grandmother; High Cholesterol in her father, mother, paternal grandfather, and paternal grandmother; High blood pressure in her maternal grandmother and mother. Social History:  Patient  reports that she has never smoked. She has never used smokeless tobacco. She reports current alcohol use. She reports that she does not use drugs.  Review of Systems: Constitutional: Negative for fever malaise or anorexia Cardiovascular: negative for chest pain Respiratory: negative for SOB or persistent cough Gastrointestinal: negative for abdominal pain  Objective  Vitals: BP 124/82   Pulse 89   Temp 97.9 F (36.6 C) (Temporal)   Resp 15   Ht 5\' 3"  (1.6 m)   Wt 257 lb (116.6 kg)   SpO2 98%   BMI 45.53 kg/m  General: no acute distress , A&Ox3 Dorsal right hand with swelling, 2 linear wounds, < 1cm each with minimal pus at edges of wounds. No fluctuance, mild induration around proximal wound. Normal grip. No LAD at elbow or axilla.    Commons  side effects, risks, benefits, and alternatives for medications and treatment plan prescribed today were discussed, and the patient expressed understanding of the given instructions. Patient is instructed to call or message via MyChart if he/she has any questions or concerns regarding our treatment plan. No barriers to understanding were identified. We discussed Red Flag symptoms and signs in detail. Patient expressed understanding regarding what to do in case of urgent or emergency type symptoms.   Medication list was reconciled, printed and provided to the patient in AVS.  Patient instructions and summary information was reviewed with the patient as documented in the AVS. This note was prepared with assistance of Dragon voice recognition software. Occasional wrong-word or sound-a-like substitutions may have occurred due to the inherent limitations of voice recognition software  This visit occurred during the SARS-CoV-2 public health emergency.  Safety protocols were in place, including screening questions prior to the visit, additional usage of staff PPE, and extensive cleaning of exam room while observing appropriate contact time as indicated for disinfecting solutions.

## 2019-07-09 NOTE — Patient Instructions (Signed)
Please return in February 2022 for your annual complete physical; please come fasting.  Complete the antibiotics. Let me know if things do not improve.   If you have any questions or concerns, please don't hesitate to send me a message via MyChart or call the office at [phone]. Thank you for visiting with [place] today! It's our pleasure caring for you.

## 2019-08-20 ENCOUNTER — Other Ambulatory Visit: Payer: Self-pay | Admitting: Family Medicine

## 2020-01-28 ENCOUNTER — Emergency Department (HOSPITAL_BASED_OUTPATIENT_CLINIC_OR_DEPARTMENT_OTHER): Payer: 59

## 2020-01-28 ENCOUNTER — Other Ambulatory Visit: Payer: Self-pay

## 2020-01-28 ENCOUNTER — Encounter (HOSPITAL_BASED_OUTPATIENT_CLINIC_OR_DEPARTMENT_OTHER): Payer: Self-pay | Admitting: *Deleted

## 2020-01-28 ENCOUNTER — Inpatient Hospital Stay (HOSPITAL_BASED_OUTPATIENT_CLINIC_OR_DEPARTMENT_OTHER)
Admission: EM | Admit: 2020-01-28 | Discharge: 2020-01-30 | DRG: 871 | Disposition: A | Discharge status: Home / Self Care | Payer: 59 | Attending: Family Medicine | Admitting: Family Medicine

## 2020-01-28 DIAGNOSIS — Z8249 Family history of ischemic heart disease and other diseases of the circulatory system: Secondary | ICD-10-CM | POA: Diagnosis not present

## 2020-01-28 DIAGNOSIS — N809 Endometriosis, unspecified: Secondary | ICD-10-CM | POA: Diagnosis present

## 2020-01-28 DIAGNOSIS — Z885 Allergy status to narcotic agent status: Secondary | ICD-10-CM

## 2020-01-28 DIAGNOSIS — J1282 Pneumonia due to coronavirus disease 2019: Secondary | ICD-10-CM

## 2020-01-28 DIAGNOSIS — E876 Hypokalemia: Secondary | ICD-10-CM | POA: Diagnosis present

## 2020-01-28 DIAGNOSIS — F410 Panic disorder [episodic paroxysmal anxiety] without agoraphobia: Secondary | ICD-10-CM | POA: Diagnosis present

## 2020-01-28 DIAGNOSIS — Z6841 Body Mass Index (BMI) 40.0 and over, adult: Secondary | ICD-10-CM

## 2020-01-28 DIAGNOSIS — K219 Gastro-esophageal reflux disease without esophagitis: Secondary | ICD-10-CM | POA: Diagnosis present

## 2020-01-28 DIAGNOSIS — Z79899 Other long term (current) drug therapy: Secondary | ICD-10-CM | POA: Diagnosis not present

## 2020-01-28 DIAGNOSIS — A4189 Other specified sepsis: Principal | ICD-10-CM | POA: Diagnosis present

## 2020-01-28 DIAGNOSIS — Z91041 Radiographic dye allergy status: Secondary | ICD-10-CM

## 2020-01-28 DIAGNOSIS — A419 Sepsis, unspecified organism: Secondary | ICD-10-CM

## 2020-01-28 DIAGNOSIS — J9601 Acute respiratory failure with hypoxia: Secondary | ICD-10-CM | POA: Diagnosis present

## 2020-01-28 DIAGNOSIS — E079 Disorder of thyroid, unspecified: Secondary | ICD-10-CM | POA: Diagnosis present

## 2020-01-28 DIAGNOSIS — E860 Dehydration: Secondary | ICD-10-CM | POA: Diagnosis present

## 2020-01-28 DIAGNOSIS — K580 Irritable bowel syndrome with diarrhea: Secondary | ICD-10-CM | POA: Diagnosis present

## 2020-01-28 DIAGNOSIS — M797 Fibromyalgia: Secondary | ICD-10-CM | POA: Diagnosis present

## 2020-01-28 DIAGNOSIS — Z83438 Family history of other disorder of lipoprotein metabolism and other lipidemia: Secondary | ICD-10-CM

## 2020-01-28 DIAGNOSIS — R9431 Abnormal electrocardiogram [ECG] [EKG]: Secondary | ICD-10-CM | POA: Diagnosis present

## 2020-01-28 DIAGNOSIS — Z91048 Other nonmedicinal substance allergy status: Secondary | ICD-10-CM

## 2020-01-28 DIAGNOSIS — R7989 Other specified abnormal findings of blood chemistry: Secondary | ICD-10-CM | POA: Diagnosis not present

## 2020-01-28 DIAGNOSIS — U071 COVID-19: Secondary | ICD-10-CM | POA: Diagnosis present

## 2020-01-28 DIAGNOSIS — Z882 Allergy status to sulfonamides status: Secondary | ICD-10-CM

## 2020-01-28 HISTORY — DX: Pneumonia due to coronavirus disease 2019: J12.82

## 2020-01-28 HISTORY — DX: COVID-19: U07.1

## 2020-01-28 LAB — FIBRINOGEN: Fibrinogen: 475 mg/dL (ref 210–475)

## 2020-01-28 LAB — LACTATE DEHYDROGENASE: LDH: 257 U/L — ABNORMAL HIGH (ref 98–192)

## 2020-01-28 LAB — URINALYSIS, MICROSCOPIC (REFLEX): WBC, UA: NONE SEEN WBC/hpf (ref 0–5)

## 2020-01-28 LAB — URINALYSIS, ROUTINE W REFLEX MICROSCOPIC
Bilirubin Urine: NEGATIVE
Glucose, UA: NEGATIVE mg/dL
Ketones, ur: 15 mg/dL — AB
Leukocytes,Ua: NEGATIVE
Nitrite: NEGATIVE
Protein, ur: NEGATIVE mg/dL
Specific Gravity, Urine: 1.02 (ref 1.005–1.030)
pH: 7.5 (ref 5.0–8.0)

## 2020-01-28 LAB — COMPREHENSIVE METABOLIC PANEL
ALT: 30 U/L (ref 0–44)
AST: 37 U/L (ref 15–41)
Albumin: 3.2 g/dL — ABNORMAL LOW (ref 3.5–5.0)
Alkaline Phosphatase: 74 U/L (ref 38–126)
Anion gap: 11 (ref 5–15)
BUN: 7 mg/dL (ref 6–20)
CO2: 25 mmol/L (ref 22–32)
Calcium: 8.1 mg/dL — ABNORMAL LOW (ref 8.9–10.3)
Chloride: 99 mmol/L (ref 98–111)
Creatinine, Ser: 0.56 mg/dL (ref 0.44–1.00)
GFR, Estimated: >60 mL/min (ref >60)
Glucose, Bld: 112 mg/dL — ABNORMAL HIGH (ref 70–99)
Potassium: 3.1 mmol/L — ABNORMAL LOW (ref 3.5–5.1)
Sodium: 135 mmol/L (ref 135–145)
Total Bilirubin: 0.5 mg/dL (ref 0.3–1.2)
Total Protein: 7.4 g/dL (ref 6.5–8.1)

## 2020-01-28 LAB — CBC WITH DIFFERENTIAL/PLATELET
Abs Immature Granulocytes: 0.01 10*3/uL (ref 0.00–0.07)
Basophils Absolute: 0 10*3/uL (ref 0.0–0.1)
Basophils Relative: 0 %
Eosinophils Absolute: 0 10*3/uL (ref 0.0–0.5)
Eosinophils Relative: 0 %
HCT: 39.7 % (ref 36.0–46.0)
Hemoglobin: 13.1 g/dL (ref 12.0–15.0)
Immature Granulocytes: 0 %
Lymphocytes Relative: 11 %
Lymphs Abs: 0.4 10*3/uL — ABNORMAL LOW (ref 0.7–4.0)
MCH: 29.1 pg (ref 26.0–34.0)
MCHC: 33 g/dL (ref 30.0–36.0)
MCV: 88.2 fL (ref 80.0–100.0)
Monocytes Absolute: 0.2 10*3/uL (ref 0.1–1.0)
Monocytes Relative: 5 %
Neutro Abs: 3.2 10*3/uL (ref 1.7–7.7)
Neutrophils Relative %: 84 %
Platelets: 200 10*3/uL (ref 150–400)
RBC: 4.5 MIL/uL (ref 3.87–5.11)
RDW: 13.5 % (ref 11.5–15.5)
WBC: 3.8 10*3/uL — ABNORMAL LOW (ref 4.0–10.5)
nRBC: 0 % (ref 0.0–0.2)

## 2020-01-28 LAB — D-DIMER, QUANTITATIVE: D-Dimer, Quant: 0.78 ug/mL-FEU — ABNORMAL HIGH (ref 0.00–0.50)

## 2020-01-28 LAB — LACTIC ACID, PLASMA: Lactic Acid, Venous: 1 mmol/L (ref 0.5–1.9)

## 2020-01-28 LAB — RESP PANEL BY RT-PCR (FLU A&B, COVID) ARPGX2
Influenza A by PCR: NEGATIVE
Influenza B by PCR: NEGATIVE
SARS Coronavirus 2 by RT PCR: POSITIVE — AB

## 2020-01-28 LAB — C-REACTIVE PROTEIN: CRP: 1.3 mg/dL — ABNORMAL HIGH (ref <1.0)

## 2020-01-28 LAB — TRIGLYCERIDES: Triglycerides: 66 mg/dL (ref <150)

## 2020-01-28 LAB — FERRITIN: Ferritin: 298 ng/mL (ref 11–307)

## 2020-01-28 MED ORDER — POTASSIUM CHLORIDE CRYS ER 20 MEQ PO TBCR
40.0000 meq | EXTENDED_RELEASE_TABLET | Freq: Once | ORAL | Status: AC
  Administered 2020-01-28: 40 meq via ORAL
  Filled 2020-01-28: qty 2

## 2020-01-28 MED ORDER — IOHEXOL 350 MG/ML SOLN: 100.0000 mL | Freq: Once | INTRAVENOUS | Status: DC | PRN

## 2020-01-28 MED ORDER — SODIUM CHLORIDE 0.9 % IV BOLUS
1000.0000 mL | Freq: Once | INTRAVENOUS | Status: DC
  Administered 2020-01-28: 1000 mL via INTRAVENOUS

## 2020-01-28 MED ORDER — SODIUM CHLORIDE 0.9 % IV SOLN
100.0000 mg | Freq: Every day | INTRAVENOUS | Status: DC
  Administered 2020-01-29 – 2020-01-30 (×2): 100 mg via INTRAVENOUS
  Filled 2020-01-28 (×3): qty 20

## 2020-01-28 MED ORDER — SODIUM CHLORIDE 0.9 % IV SOLN
100.0000 mg | INTRAVENOUS | Status: AC
  Administered 2020-01-28 (×2): 100 mg via INTRAVENOUS
  Filled 2020-01-28 (×2): qty 20

## 2020-01-28 MED ORDER — DEXAMETHASONE SODIUM PHOSPHATE 10 MG/ML IJ SOLN
6.0000 mg | Freq: Once | INTRAMUSCULAR | Status: AC
  Administered 2020-01-28: 6 mg via INTRAVENOUS
  Filled 2020-01-28: qty 1

## 2020-01-28 NOTE — ED Notes (Signed)
Patient transported to CT 

## 2020-01-28 NOTE — ED Notes (Signed)
Placed patient on 2lpm [place].

## 2020-01-28 NOTE — ED Provider Notes (Signed)
[facility] EMERGENCY DEPARTMENT Provider Note   CSN: [id number] Arrival date & time: 01/28/20  1545     History Chief Complaint  Patient presents with  . Weakness    Adrienne Summers is a 49 y.o. female past medical history significant for anxiety, endometriosis, fibromyalgia, GERD, IBS, interstitial cystitis, morbid obesity, thyroid disease. Did not have covid vaccinations  HPI Patient presents to emergency room today via EMS with chief complaint of generalized weakness x 1 week.  States she has been feeling poorly overall for 3 weeks however it has worsened in the last week.  She is having nasal congestion, productive cough, decreased appetite, diarrhea.  She has had 3 episodes of nonbloody diarrhea today.  She has tried to drink plenty of water but states she has not been eating very much because of the decreased appetite.  She just returned from a beach vacation with her family.  She states while they were there they mostly stayed in [place] and tried avoiding being around people.  She has not tried any medications for symptoms prior to arrival.  She states she called EMS today because she was very nervous and felt so weak that she could not get up and move around.  Denies any fever, chills, shortness of breath, chest pain, abdominal pain, nausea, emesis, urinary symptoms, rash.       Past Medical History:  Diagnosis Date  . Anxiety   . Endometriosis   . Fibromyalgia   . GERD (gastroesophageal reflux disease) 04/13/2018  . IBS (irritable bowel syndrome)   . Interstitial cystitis 04/13/2018  . Morbid obesity ([facility]) 04/13/2018  . Panic disorder 04/13/2018  . Thyroid disease     Patient Active Problem List   Diagnosis Date Noted  . Pneumonia due to COVID-19 virus 01/28/2020  . Rosacea 04/28/2019  . Fibrocystic breast changes, bilateral 04/28/2019  . Fibromyalgia 04/13/2018  . Interstitial cystitis 04/13/2018  . GERD (gastroesophageal reflux disease) 04/13/2018  .  Subclinical hypothyroidism 04/13/2018  . Panic disorder 04/13/2018  . IBS (irritable bowel syndrome) 04/13/2018  . Morbid obesity ([facility]) 04/13/2018    Past Surgical History:  Procedure Laterality Date  . ENDOMETRIAL ABLATION     Dr. Jennette Kettle     OB History   No obstetric history on file.     Family History  Problem Relation Age of Onset  . Healthy Daughter   . Cancer Mother   . Hearing loss Mother   . High Cholesterol Mother   . High blood pressure Mother   . Hearing loss Father   . High Cholesterol Father   . Cancer Brother   . Cancer Maternal Grandmother   . Heart attack Maternal Grandmother   . High blood pressure Maternal Grandmother   . Heart attack Maternal Grandfather   . Cancer Paternal Grandmother   . High Cholesterol Paternal Grandmother   . High Cholesterol Paternal Grandfather     Social History   Tobacco Use  . Smoking status: Never Smoker  . Smokeless tobacco: Never Used  Vaping Use  . Vaping Use: Never used  Substance Use Topics  . Alcohol use: Yes  . Drug use: Never    Home Medications Prior to Admission medications   Medication Sig Start Date End Date Taking? Authorizing Provider  ALPRAZolam Prudy Feeler) 0.25 MG tablet Take 0.25 mg by mouth as needed for anxiety.   Yes [provider]  cetirizine (ZYRTEC) 10 MG tablet Take 10 mg by mouth daily.   Yes [provider]  ibuprofen (ADVIL,MOTRIN) 200 MG tablet Take 200 mg by mouth as needed.   Yes [provider]  sertraline (ZOLOFT) 50 MG tablet TAKE 1 TABLET BY MOUTH EVERY DAY 08/20/19  Yes Willow Ora, MD  azithromycin [facility]) 250 MG tablet Take 2 tabs today, then 1 tab daily for 4 days 07/09/19   Willow Ora, MD    Allergies    Codeine, Tape, Penicillins, Sulfa antibiotics, and Sulfamethoxazole  Review of Systems   Review of Systems All other systems are reviewed and are negative for acute change except as noted in the HPI.  Physical Exam Updated Vital  Signs BP (!) 115/91   Pulse (!) 104   Temp 98.2 F (36.8 C) (Oral)   Resp (!) 22   Ht 5\' 3"  (1.6 m)   Wt 116.3 kg   SpO2 97%   BMI 45.42 kg/m   Physical Exam Vitals and nursing note reviewed.  Constitutional:      General: She is not in acute distress.    Appearance: She is obese. She is ill-appearing.  HENT:     Head: Normocephalic and atraumatic.     Right Ear: Tympanic membrane and external ear normal.     Left Ear: Tympanic membrane and external ear normal.     Nose: Congestion present.     Mouth/Throat:     Mouth: Mucous membranes are dry.     Pharynx: Oropharynx is clear.  Eyes:     General: No scleral icterus.       Right eye: No discharge.        Left eye: No discharge.     Extraocular Movements: Extraocular movements intact.     Conjunctiva/sclera: Conjunctivae normal.     Pupils: Pupils are equal, round, and reactive to light.  Neck:     Vascular: No JVD.  Cardiovascular:     Rate and Rhythm: Regular rhythm. Tachycardia present.     Pulses: Normal pulses.          Radial pulses are 2+ on the right side and 2+ on the left side.     Heart sounds: Normal heart sounds.  Pulmonary:     Comments: Lungs clear to auscultation in all fields. Symmetric chest rise. No wheezing, rales, or rhonchi. Abdominal:     Comments: Abdomen is soft, non-distended, and non-tender in all quadrants. No rigidity, no guarding. No peritoneal signs.  Musculoskeletal:        General: Normal range of motion.     Cervical back: Normal range of motion.  Skin:    General: Skin is warm and dry.     Capillary Refill: Capillary refill takes less than 2 seconds.  Neurological:     Mental Status: She is oriented to person, place, and time.     GCS: GCS eye subscore is 4. GCS verbal subscore is 5. GCS motor subscore is 6.     Comments: Fluent speech, no facial droop.  Psychiatric:        Behavior: Behavior normal.     ED Results / Procedures / Treatments   Labs (all labs ordered are  listed, but only abnormal results are displayed) Labs Reviewed  RESP PANEL BY RT-PCR (FLU A&B, COVID) ARPGX2 - Abnormal; Notable for the following components:      Result Value   SARS Coronavirus 2 by RT PCR POSITIVE (*)    All other components within normal limits  D-DIMER, QUANTITATIVE (NOT AT [facility]) - Abnormal; Notable for the  following components:   D-Dimer, Quant 0.78 (*)    All other components within normal limits  CBC WITH DIFFERENTIAL/PLATELET - Abnormal; Notable for the following components:   WBC 3.8 (*)    Lymphs Abs 0.4 (*)    All other components within normal limits  COMPREHENSIVE METABOLIC PANEL - Abnormal; Notable for the following components:   Potassium 3.1 (*)    Glucose, Bld 112 (*)    Calcium 8.1 (*)    Albumin 3.2 (*)    All other components within normal limits  CULTURE, BLOOD (ROUTINE X 2)  CULTURE, BLOOD (ROUTINE X 2)  URINALYSIS, ROUTINE W REFLEX MICROSCOPIC  LACTIC ACID, PLASMA  LACTIC ACID, PLASMA  PROCALCITONIN  LACTATE DEHYDROGENASE  FERRITIN  TRIGLYCERIDES  FIBRINOGEN  C-REACTIVE PROTEIN    EKG EKG Interpretation  Date/Time:  Friday January 28 2020 17:23:04 EST Ventricular Rate:  101 PR Interval:    QRS Duration: 77 QT Interval:  351 QTC Calculation: 455 R Axis:   50 Text Interpretation: Sinus tachycardia Anterior infarct, age indeterminate No old tracing to compare Confirmed by Dione Booze ([id number]) on 01/28/2020 11:34:00 PM   Radiology DG Chest Portable 1 View  Result Date: 01/28/2020 CLINICAL DATA:  Hypoxia short of breath EXAM: PORTABLE CHEST 1 VIEW COMPARISON:  None. FINDINGS: Low lung volumes. Patchy left greater than right basilar opacity. No pleural effusion or pneumothorax. IMPRESSION: Low lung volumes with patchy left greater than right basilar atelectasis or pneumonia. Electronically Signed   By: Jasmine Pang M.D.   On: 01/28/2020 18:01    Procedures Procedures (including critical care time)  Medications Ordered in  ED Medications  remdesivir 100 mg in sodium chloride 0.9 % 100 mL IVPB (100 mg Intravenous New Bag/Given 01/28/20 1956)  remdesivir 100 mg in sodium chloride 0.9 % 100 mL IVPB (has no administration in time range)  iohexol (OMNIPAQUE) 350 MG/ML injection 100 mL (has no administration in time range)  dexamethasone (DECADRON) injection 6 mg (6 mg Intravenous Given 01/28/20 1945)  potassium chloride SA (KLOR-CON) CR tablet 40 mEq (40 mEq Oral Given 01/28/20 1945)    ED Course  I have reviewed the triage vital signs and the nursing notes.  Pertinent labs & imaging results that were available during my care of the patient were reviewed by me and considered in my medical decision making (see chart for details).  Vitals:   01/28/20 1611 01/28/20 1645 01/28/20 1742 01/28/20 1903  BP: (!) 108/92 (!) 115/91 112/88 117/81  Pulse: 97 (!) 104 (!) 110 (!) 111  Resp: (!) 22  15 20   Temp:      TempSrc:      SpO2: 90% 97% 97% 97%  Weight:      Height:           MDM Rules/Calculators/A&P                          History provided by patient with additional history obtained from chart review.    26 female presenting with generalized weakness and URI symptoms.  On ED arrival she is afebrile, with soft blood pressure and hypoxia at 85%.  She is ill-appearing, however does not look toxic.  She is placed on a liter nasal cannula and improvement went to 94%.  Her lung sounds are diminished throughout.  She is speaking in full sentences.  She does look dehydrated, mucous membranes are dry.  She has nasal congestion.  No abdominal tenderness, no peritoneal  signs. She was given a liter of fluids because of significantly decreased PO intake since symptom onset and dehydration on exam. Covid test is positive.  With her hypoxia and new oxygen requirement suspect of admission labs ordered. EKG shows sinus tachycardia. CBC with leukopenia, white count 3.8, no anemia.  CMP with mild hypokalemia 3.1, will replete  with p.o. potassium, no renal insufficiency, no transaminitis.  D-dimer is elevated at 0.78.  Multiple other labs are are still in process. Chest x-ray viewed by me shows Covid pneumonia. Decadron and remdesivir ordered. This case was discussed with Dr. Donnald GarrePfeiffer who has seen the patient and agrees with plan to admit.  Spoke with Dr. Rachael Darbyhotiner with hospitalist service who agrees to assume care of patient and bring into the hospital for further evaluation and management.    He asked for CTA chest to be ordered to rule out PE.  Order has been placed.  [facility]  It is possible her CT scan will result while she is no longer in the department.  Would kindly ask hospitalist to follow-up on results.   Adrienne Summers was evaluated in Emergency Department on 01/28/2020 for the symptoms described in the history of present illness. She was evaluated in the context of the global COVID-19 pandemic, which necessitated consideration that the patient might be at risk for infection with the SARS-CoV-2 virus that causes COVID-19. Institutional protocols and algorithms that pertain to the evaluation of patients at risk for COVID-19 are in a state of rapid change based on information released by regulatory bodies including the CDC and federal and state organizations. These policies and algorithms were followed during the patient's care in the ED.   Portions of this note were generated with Scientist, clinical (histocompatibility and immunogenetics)Dragon dictation software. Dictation errors may occur despite best attempts at proofreading.  Final Clinical Impression(s) / ED Diagnoses Final diagnoses:  Pneumonia due to COVID-19 virus    Rx / DC Orders ED Discharge Orders    None       Kandice HamsWalisiewicz, Yehoshua Vitelli E, PA-C 01/29/20 [id number]    Arby BarrettePfeiffer, Marcy, MD 01/29/20 1527

## 2020-01-28 NOTE — ED Triage Notes (Signed)
Per EMS:  Pt having nasal congestion, clear nasal discharge.  Denies fever.  Some diarrhea.  All symptoms for 3 weeks.  Pt having generalized weakness.  Not vaccinated.

## 2020-01-29 ENCOUNTER — Inpatient Hospital Stay (HOSPITAL_COMMUNITY): Payer: 59

## 2020-01-29 DIAGNOSIS — J1282 Pneumonia due to coronavirus disease 2019: Secondary | ICD-10-CM

## 2020-01-29 DIAGNOSIS — U071 COVID-19: Secondary | ICD-10-CM | POA: Diagnosis not present

## 2020-01-29 DIAGNOSIS — R7989 Other specified abnormal findings of blood chemistry: Secondary | ICD-10-CM | POA: Diagnosis not present

## 2020-01-29 DIAGNOSIS — A419 Sepsis, unspecified organism: Secondary | ICD-10-CM

## 2020-01-29 DIAGNOSIS — J9601 Acute respiratory failure with hypoxia: Secondary | ICD-10-CM

## 2020-01-29 DIAGNOSIS — R9431 Abnormal electrocardiogram [ECG] [EKG]: Secondary | ICD-10-CM

## 2020-01-29 DIAGNOSIS — E876 Hypokalemia: Secondary | ICD-10-CM

## 2020-01-29 LAB — ECHOCARDIOGRAM LIMITED
Area-P 1/2: 4.39 cm2
Calc EF: 62 %
Height: 63 in
S' Lateral: 2.9 cm
Single Plane A2C EF: 60.4 %
Single Plane A4C EF: 61.4 %
Weight: 4021.19 oz

## 2020-01-29 LAB — CBC WITH DIFFERENTIAL/PLATELET
Abs Immature Granulocytes: 0.01 10*3/uL (ref 0.00–0.07)
Basophils Absolute: 0 10*3/uL (ref 0.0–0.1)
Basophils Relative: 0 %
Eosinophils Absolute: 0 10*3/uL (ref 0.0–0.5)
Eosinophils Relative: 0 %
HCT: 40 % (ref 36.0–46.0)
Hemoglobin: 13.1 g/dL (ref 12.0–15.0)
Immature Granulocytes: 1 %
Lymphocytes Relative: 19 %
Lymphs Abs: 0.4 10*3/uL — ABNORMAL LOW (ref 0.7–4.0)
MCH: 29.2 pg (ref 26.0–34.0)
MCHC: 32.8 g/dL (ref 30.0–36.0)
MCV: 89.1 fL (ref 80.0–100.0)
Monocytes Absolute: 0.1 10*3/uL (ref 0.1–1.0)
Monocytes Relative: 5 %
Neutro Abs: 1.4 10*3/uL — ABNORMAL LOW (ref 1.7–7.7)
Neutrophils Relative %: 75 %
Platelets: 213 10*3/uL (ref 150–400)
RBC: 4.49 MIL/uL (ref 3.87–5.11)
RDW: 13.5 % (ref 11.5–15.5)
WBC: 1.9 10*3/uL — ABNORMAL LOW (ref 4.0–10.5)
nRBC: 0 % (ref 0.0–0.2)

## 2020-01-29 LAB — TSH: TSH: 2.338 u[IU]/mL (ref 0.350–4.500)

## 2020-01-29 LAB — COMPREHENSIVE METABOLIC PANEL
ALT: 27 U/L (ref 0–44)
AST: 26 U/L (ref 15–41)
Albumin: 3.3 g/dL — ABNORMAL LOW (ref 3.5–5.0)
Alkaline Phosphatase: 79 U/L (ref 38–126)
Anion gap: 9 (ref 5–15)
BUN: 6 mg/dL (ref 6–20)
CO2: 23 mmol/L (ref 22–32)
Calcium: 8.3 mg/dL — ABNORMAL LOW (ref 8.9–10.3)
Chloride: 104 mmol/L (ref 98–111)
Creatinine, Ser: 0.53 mg/dL (ref 0.44–1.00)
GFR, Estimated: >60 mL/min (ref >60)
Glucose, Bld: 126 mg/dL — ABNORMAL HIGH (ref 70–99)
Potassium: 3.2 mmol/L — ABNORMAL LOW (ref 3.5–5.1)
Sodium: 136 mmol/L (ref 135–145)
Total Bilirubin: 0.6 mg/dL (ref 0.3–1.2)
Total Protein: 7.4 g/dL (ref 6.5–8.1)

## 2020-01-29 LAB — PROCALCITONIN: Procalcitonin: <0.1 ng/mL

## 2020-01-29 LAB — MAGNESIUM
Magnesium: 1.9 mg/dL (ref 1.7–2.4)
Magnesium: 2 mg/dL (ref 1.7–2.4)

## 2020-01-29 LAB — TROPONIN I (HIGH SENSITIVITY): Troponin I (High Sensitivity): <2 ng/L (ref <18)

## 2020-01-29 LAB — D-DIMER, QUANTITATIVE: D-Dimer, Quant: 0.69 ug/mL-FEU — ABNORMAL HIGH (ref 0.00–0.50)

## 2020-01-29 MED ORDER — ALPRAZOLAM 0.25 MG PO TABS
0.2500 mg | ORAL_TABLET | Freq: Two times a day (BID) | ORAL | Status: DC | PRN
  Administered 2020-01-29 – 2020-01-30 (×3): 0.25 mg via ORAL
  Filled 2020-01-29 (×3): qty 1

## 2020-01-29 MED ORDER — PERFLUTREN LIPID MICROSPHERE
1.0000 mL | INTRAVENOUS | Status: AC | PRN
  Administered 2020-01-29: 2 mL via INTRAVENOUS
  Filled 2020-01-29: qty 10

## 2020-01-29 MED ORDER — GUAIFENESIN-DM 100-10 MG/5ML PO SYRP: 10.0000 mL | ORAL_SOLUTION | ORAL | Status: DC | PRN

## 2020-01-29 MED ORDER — VITAMIN D 25 MCG (1000 UNIT) PO TABS
1000.0000 [IU] | ORAL_TABLET | Freq: Every day | ORAL | Status: DC
  Administered 2020-01-29 – 2020-01-30 (×2): 1000 [IU] via ORAL
  Filled 2020-01-29 (×2): qty 1

## 2020-01-29 MED ORDER — ALPRAZOLAM 0.25 MG PO TABS: 0.2500 mg | ORAL_TABLET | ORAL | Status: DC | PRN

## 2020-01-29 MED ORDER — ENOXAPARIN SODIUM 60 MG/0.6ML ~~LOC~~ SOLN: 60.0000 mg | SUBCUTANEOUS | Status: DC

## 2020-01-29 MED ORDER — ENOXAPARIN SODIUM 120 MG/0.8ML ~~LOC~~ SOLN
115.0000 mg | Freq: Two times a day (BID) | SUBCUTANEOUS | Status: DC
  Filled 2020-01-29: qty 0.76

## 2020-01-29 MED ORDER — ENOXAPARIN SODIUM 120 MG/0.8ML ~~LOC~~ SOLN
115.0000 mg | Freq: Two times a day (BID) | SUBCUTANEOUS | Status: DC
  Administered 2020-01-29 (×2): 115 mg via SUBCUTANEOUS
  Filled 2020-01-29 (×3): qty 0.76

## 2020-01-29 MED ORDER — ASCORBIC ACID 500 MG PO TABS
500.0000 mg | ORAL_TABLET | Freq: Every day | ORAL | Status: DC
  Administered 2020-01-29 – 2020-01-30 (×2): 500 mg via ORAL
  Filled 2020-01-29 (×2): qty 1

## 2020-01-29 MED ORDER — ALPRAZOLAM 0.25 MG PO TABS
0.2500 mg | ORAL_TABLET | Freq: Once | ORAL | Status: AC
  Administered 2020-01-29: 0.25 mg via ORAL
  Filled 2020-01-29: qty 1

## 2020-01-29 MED ORDER — ENOXAPARIN SODIUM 120 MG/0.8ML ~~LOC~~ SOLN
1.0000 mg/kg | Freq: Two times a day (BID) | SUBCUTANEOUS | Status: DC
  Administered 2020-01-29: 117 mg via SUBCUTANEOUS
  Filled 2020-01-29: qty 0.78

## 2020-01-29 MED ORDER — SIMETHICONE 80 MG PO CHEW: 80.0000 mg | CHEWABLE_TABLET | Freq: Four times a day (QID) | ORAL | Status: DC | PRN

## 2020-01-29 MED ORDER — POTASSIUM CHLORIDE CRYS ER 20 MEQ PO TBCR
40.0000 meq | EXTENDED_RELEASE_TABLET | Freq: Once | ORAL | Status: AC
  Administered 2020-01-29: 40 meq via ORAL
  Filled 2020-01-29: qty 2

## 2020-01-29 MED ORDER — METHYLPREDNISOLONE SODIUM SUCC 125 MG IJ SOLR
60.0000 mg | Freq: Two times a day (BID) | INTRAMUSCULAR | Status: DC
  Administered 2020-01-29 – 2020-01-30 (×3): 60 mg via INTRAVENOUS
  Filled 2020-01-29 (×3): qty 2

## 2020-01-29 MED ORDER — PREDNISONE 50 MG PO TABS: 50.0000 mg | ORAL_TABLET | Freq: Every day | ORAL | Status: DC

## 2020-01-29 MED ORDER — ZINC SULFATE 220 (50 ZN) MG PO CAPS
220.0000 mg | ORAL_CAPSULE | Freq: Every day | ORAL | Status: DC
  Administered 2020-01-29 – 2020-01-30 (×2): 220 mg via ORAL
  Filled 2020-01-29 (×2): qty 1

## 2020-01-29 MED ORDER — ACETAMINOPHEN 325 MG PO TABS: 650.0000 mg | ORAL_TABLET | Freq: Four times a day (QID) | ORAL | Status: DC | PRN

## 2020-01-29 MED ORDER — ALBUTEROL SULFATE HFA 108 (90 BASE) MCG/ACT IN AERS: 2.0000 | INHALATION_SPRAY | RESPIRATORY_TRACT | Status: DC | PRN

## 2020-01-29 NOTE — Plan of Care (Signed)

## 2020-01-29 NOTE — Progress Notes (Signed)
  Echocardiogram 2D Echocardiogram has been performed.  Adrienne Summers 01/29/2020, 11:11 AM

## 2020-01-29 NOTE — Progress Notes (Signed)
PROGRESS NOTE    Adrienne Summers  TDV:[id number] DOB: 05-Nov-1970 DOA: 01/28/2020 PCP: Leamon Arnt, MD   Chief Complaint  Patient presents with  . Weakness    Brief Narrative: 49 yo F with hx anxiety and panic disorder, endometriosis, fibromyalgia, GERD, IBS, interstitial cystitis, morbid obesity, thyroid disease who presented to the ED with weakness and URI symptoms.  About 3 weeks of cough, worse over the past week.  Admitted for COVID 19 pneumonia treatment.   Assessment & Plan:   Principal Problem:   Pneumonia due to COVID-19 virus Active Problems:   Acute hypoxemic respiratory failure (HCC)   Sepsis (HCC)   Hypokalemia   Abnormal EKG  Acute hypoxemic respiratory failure COVID-19 viral pneumonia Sepsis Met criteria for sepsis based on evaluation by admitting physician (tachycardic and tachypneic with covid pneumonia)  CXR 11/26 with low lung volumes with patchy L greater than R basilar atelectasis or pneumonia Currently satting in the low 90's on 1 L - wean for goal > 88% Procal <0.1, low suspicion for bacterial infection  Started on therapeutic anticoagulation with D dimer 0.78, suspect this is due to inflammation from covid, lower suspicion at this time for VTE -> trend d dimer and follow LE [place] -> consider deescalation to ppx lovenox continue remdesivir, steroids - consider baricitinib/actemra if worsening Strict I/O, daily weights Prone as able, OOB, IS, flutter  COVID-19 Labs  Recent Labs    01/28/20 1730 01/29/20 0833  DDIMER 0.78* 0.69*  FERRITIN 298  --   LDH 257*  --   CRP 1.3*  --     Lab Results  Component Value Date   SARSCOV2NAA POSITIVE (Markey Deady) 01/28/2020   Mild hypokalemia Replace and follow   Abnormal EKG  S1Q3T3 Pattern - abnormal EKG.  Mildly elevated d dimer.  Currently on full dose lovenox.  Follow echo, consider VQ scan.  Anxiety Reports she's on xanax for anxiety, will continue for now (not on PMP aware)  Morbid obesity  (BMI 45.42) -Encourage lifestyle modifications including healthy eating and exercise for weight loss after patient recovers from her acute viral illness.  History of thyroid disease Not on any medications. -Check TSH  DVT prophylaxis: lovenox Code Status: full  Family Communication: none at bedside, declines me calling Disposition:   Status is: Inpatient  Remains inpatient appropriate because:Inpatient level of care appropriate due to severity of illness   Dispo: The patient is from: Home              Anticipated d/c is to: Home              Anticipated d/c date is: > 3 days              Patient currently is not medically stable to d/c.  Consultants:   none  Procedures:   none  Antimicrobials: Anti-infectives (From admission, onward)   Start     Dose/Rate Route Frequency Ordered Stop   01/29/20 1000  remdesivir 100 mg in sodium chloride 0.9 % 100 mL IVPB        100 mg 200 mL/hr over 30 Minutes Intravenous Daily 01/28/20 1912 02/02/20 0959   01/28/20 1930  remdesivir 100 mg in sodium chloride 0.9 % 100 mL IVPB        100 mg 200 mL/hr over 30 Minutes Intravenous Every 30 min 01/28/20 1912 01/28/20 2130     Subjective: No new complaints Anxious, unchanged SOB/cough  Objective: Vitals:   01/28/20 2324 01/29/20 0009 01/29/20  0444 01/29/20 0811  BP: (!) 130/98 135/90 130/84 (!) 126/92  Pulse: (!) 102 96 93 91  Resp: 20 (!) _0 Temp: 98 F (36.7 C) 99.1 F (37.3 C) 99 F (37.2 C) 99.2 F (37.3 C)  TempSrc:  Oral Oral   SpO2: 95% 95% 96% 94%  Weight:  114 kg    Height:  _1  (1.6 m)     No intake or output data in the 24 hours ending 01/29/20 0855 Filed Weights   01/28/20 1552 01/28/20 1553 01/29/20 0009  Weight: 113.4 kg 116.3 kg 114 kg    Examination:  General exam: Appears calm and comfortable  Respiratory system: unlabored, on 1 L, satting in low 90's Cardiovascular system: S1 & S2 heard, RRR Gastrointestinal system: Abdomen is nondistended,  soft and nontender. Central nervous system: Alert and oriented. No focal neurological deficits. Extremities: no LEE Skin: No rashes, lesions or ulcers Psychiatry: Judgement and insight appear normal. Mood & affect appropriate.     Data Reviewed: I have personally reviewed following labs and imaging studies  CBC: Recent Labs  Lab 01/28/20 1730 01/29/20 0833  WBC 3.8* 1.9*  NEUTROABS 3.2 PENDING  HGB 13.1 13.1  HCT 39.7 40.0  MCV 88.2 89.1  PLT 200 767    Basic Metabolic Panel: Recent Labs  Lab 01/28/20 1730 01/29/20 0119  NA 135  --   K 3.1*  --   CL 99  --   CO2 25  --   GLUCOSE 112*  --   BUN 7  --   CREATININE 0.56  --   CALCIUM 8.1*  --   MG  --  1.9    GFR: Estimated Creatinine Clearance: 105.7 mL/min (by C-G formula based on SCr of 0.56 mg/dL).  Liver Function Tests: Recent Labs  Lab 01/28/20 1730  AST 37  ALT 30  ALKPHOS 74  BILITOT 0.5  PROT 7.4  ALBUMIN 3.2*    CBG: No results for input(s): GLUCAP in the last 168 hours.   Recent Results (from the past 240 hour(s))  Resp Panel by RT-PCR (Flu Eural Holzschuh&B, Covid) Nasopharyngeal Swab     Status: Abnormal   Collection Time: 01/28/20  4:01 PM   Specimen: Nasopharyngeal Swab; Nasopharyngeal(NP) swabs in vial transport medium  Result Value Ref Range Status   SARS Coronavirus 2 by RT PCR POSITIVE ([facility]) NEGATIVE Final    Comment: RESULT CALLED TO, READ BACK BY AND VERIFIED WITH: MARVA SIMMS RN AT 2094 ON 01/28/20 BY I.SUGUT (NOTE) SARS-CoV-2 target nucleic acids are DETECTED.  The SARS-CoV-2 RNA is generally detectable in upper respiratory specimens during the acute phase of infection. Positive results are indicative of the presence of the identified virus, but do not rule out bacterial infection or co-infection with other pathogens not detected by the test. Clinical correlation with patient history and other diagnostic information is necessary to determine patient infection status. The expected result  is Negative.  Fact Sheet for Patients: EntrepreneurPulse.com.au  Fact Sheet for Healthcare Providers: IncredibleEmployment.be  This test is not yet approved or cleared by the [place] FDA and  has been authorized for detection and/or diagnosis of SARS-CoV-2 by FDA under an Emergency Use Authorization (EUA).  This EUA will remain in effect (meaning this  test can be used) for the duration of  the COVID-19 declaration under Section 564(b)(1) of the Act, 21 U.S.C. section 360bbb-3(b)(1), unless the authorization is terminated or revoked sooner.     Influenza Franceen Erisman by PCR NEGATIVE  NEGATIVE Final   Influenza B by PCR NEGATIVE NEGATIVE Final    Comment: (NOTE) The Xpert Xpress SARS-CoV-2/FLU/RSV plus assay is intended as an aid in the diagnosis of influenza from Nasopharyngeal swab specimens and should not be used as [place] sole basis for treatment. Nasal washings and aspirates are unacceptable for Xpert Xpress SARS-CoV-2/FLU/RSV testing.  Fact Sheet for Patients: EntrepreneurPulse.com.au  Fact Sheet for Healthcare Providers: IncredibleEmployment.be  This test is not yet approved or cleared by the [place] FDA and has been authorized for detection and/or diagnosis of SARS-CoV-2 by FDA under an Emergency Use Authorization (EUA). This EUA will remain in effect (meaning this test can be used) for the duration of the COVID-19 declaration under Section 564(b)(1) of the Act, 21 U.S.C. section 360bbb-3(b)(1), unless the authorization is terminated or revoked.  Performed at [facility], [address] [place], [place] [address]   Blood Culture (routine x 2)     Status: None (Preliminary result)   Collection Time: 01/28/20  5:00 PM   Specimen: BLOOD  Result Value Ref Range Status   Specimen Description   Final    BLOOD LEFT ANTECUBITAL Performed at [facility], [address] [place], [place] [address]    Special Requests   Final    BOTTLES DRAWN AEROBIC AND ANAEROBIC Blood Culture adequate volume Performed at [facility], [address] [place], [place] [address]    Culture   Final    NO GROWTH < 12 HOURS Performed at [facility], [address], [place], [place] [address]    Report Status PENDING  Incomplete  Blood Culture (routine x 2)     Status: None (Preliminary result)   Collection Time: 01/28/20  7:12 PM   Specimen: BLOOD  Result Value Ref Range Status   Specimen Description   Final    BLOOD BLOOD RIGHT HAND Performed at [facility], [address] [place], [place] [address]    Special Requests   Final    BOTTLES DRAWN AEROBIC AND ANAEROBIC Blood Culture adequate volume Performed at [facility], [address] [place], [place] [address]    Culture   Final    NO GROWTH < 12 HOURS Performed at [facility], [place], [place],  [address]    Report Status PENDING  Incomplete         Radiology Studies: DG Chest Portable 1 View  Result Date: 01/28/2020 CLINICAL DATA:  Hypoxia short of breath EXAM: PORTABLE CHEST 1 VIEW COMPARISON:  None. FINDINGS: Low lung volumes. Patchy left greater than right basilar opacity. No pleural effusion or pneumothorax. IMPRESSION: Low lung volumes with patchy left greater than right basilar atelectasis or pneumonia. Electronically Signed   By: Donavan Foil M.D.   On: 01/28/2020 18:01        Scheduled Meds: . vitamin C  500 mg Oral Daily  . cholecalciferol  1,000 Units Oral Daily  . enoxaparin (LOVENOX) injection  1 mg/kg Subcutaneous Q12H  . methylPREDNISolone (SOLU-MEDROL) injection  60 mg Intravenous Q12H   Followed by  . [START ON 02/01/2020] predniSONE  50 mg Oral Daily  . zinc sulfate  220 mg Oral Daily   Continuous Infusions: . remdesivir 100 mg in NS 100 mL       LOS: 1 day    Time spent: over 30 min     Fayrene Helper, MD [facility]   To contact the attending provider between 7A-7P or the covering provider during after hours 7P-7A, please log into the web site [email or web address] and access using universal [place] password for that web site. If you do not have the password, please call the hospital operator.  01/29/2020, 8:55 AM

## 2020-01-29 NOTE — H&P (Signed)
History and Physical    Marney Treloar URK:[id number] DOB: 1970/12/02 DOA: 01/28/2020  PCP: Willow Ora, MD Patient coming from: Home  Chief Complaint: Generalized weakness  HPI: Adrienne Summers is a 49 y.o. female with medical history significant of anxiety, panic disorder, endometriosis, fibromyalgia, GERD, IBS, interstitial cystitis, morbid obesity (BMI 45.42), thyroid disease presented to the ED with complaints of generalized weakness and URI symptoms.  She has not been vaccinated against COVID-19.  Patient states about 3 weeks ago she had a mild cough but for the past 1 week it has been much worse.  Also endorsing rhinorrhea and fatigue.  She has had some nausea but has not vomited.  She had diarrhea for a day a few days ago which has now resolved.  Denies fevers, chills, shortness of breath, chest pain, or abdominal pain.  ED Course: Afebrile.  Slightly tachycardic and tachypneic.  Blood pressure soft on arrival.  SPO2 85% on room air, improved with 1 L supplemental oxygen.  SARS-CoV-2 PCR test positive.  Influenza panel negative.  WBC 3.8 (absolute lymphocyte count low), hemoglobin 13.1, hematocrit 39.7, platelet 200K.  Sodium 135, potassium 3.1, chloride 99, bicarb 25, BUN 7, creatinine 0.5, glucose 112.  LFTs normal.  Blood culture x2 pending.  Procalcitonin <0.10.  Lactic acid 1.0.  Inflammatory markers slightly elevated: D-dimer 0.78, LDH 257, CRP 1.3.  UA not suggestive of infection.  Chest x-ray personally reviewed showing bibasilar opacities (left greater than right) concerning for COVID-19 viral pneumonia.  Patient was given IV Decadron 6 mg, remdesivir, oral potassium 40 mEq, and 1 L normal saline bolus.  Review of Systems:  All systems reviewed and apart from history of presenting illness, are negative.  Past Medical History:  Diagnosis Date  . Anxiety   . Endometriosis   . Fibromyalgia   . GERD (gastroesophageal reflux disease) 04/13/2018  . IBS (irritable  bowel syndrome)   . Interstitial cystitis 04/13/2018  . Morbid obesity (HCC) 04/13/2018  . Panic disorder 04/13/2018  . Thyroid disease     Past Surgical History:  Procedure Laterality Date  . ENDOMETRIAL ABLATION     Dr. Jennette Kettle     reports that she has never smoked. She has never used smokeless tobacco. She reports current alcohol use. She reports that she does not use drugs.  Allergies  Allergen Reactions  . Contrast Media [Iodinated Diagnostic Agents] Hives and Itching  . Codeine Nausea And Vomiting and Diarrhea  . Tape Itching and Other (See Comments)    Adhesive Tape - Pulls skin off; makes skin red  . Penicillins Rash  . Sulfa Antibiotics Rash  . Sulfamethoxazole Rash    Family History  Problem Relation Age of Onset  . Healthy Daughter   . Cancer Mother   . Hearing loss Mother   . High Cholesterol Mother   . High blood pressure Mother   . Hearing loss Father   . High Cholesterol Father   . Cancer Brother   . Cancer Maternal Grandmother   . Heart attack Maternal Grandmother   . High blood pressure Maternal Grandmother   . Heart attack Maternal Grandfather   . Cancer Paternal Grandmother   . High Cholesterol Paternal Grandmother   . High Cholesterol Paternal Grandfather     Prior to Admission medications   Medication Sig Start Date End Date Taking? Authorizing Provider  ALPRAZolam Prudy Feeler) 0.25 MG tablet Take 0.25 mg by mouth as needed for anxiety.   Yes [provider]  cetirizine (  ZYRTEC) 10 MG tablet Take 10 mg by mouth daily.   Yes [provider]  ibuprofen (ADVIL,MOTRIN) 200 MG tablet Take 200 mg by mouth as needed.   Yes [provider]  sertraline (ZOLOFT) 50 MG tablet TAKE 1 TABLET BY MOUTH EVERY DAY 08/20/19  Yes Willow Ora, MD  azithromycin [facility]) 250 MG tablet Take 2 tabs today, then 1 tab daily for 4 days 07/09/19   Willow Ora, MD    Physical Exam: Vitals:   01/28/20 2245 01/28/20 2323 01/28/20 2324 01/29/20  0009  BP: (!) 129/99  (!) 130/98 135/90  Pulse: (!) 105 (!) 102 (!) 102 96  Resp: 17 20 20  (!) 22  Temp:   98 F (36.7 C)   TempSrc:      SpO2: 94% 95% 95% 95%  Weight:      Height:        Physical Exam Constitutional:      General: She is not in acute distress. HENT:     Head: Normocephalic and atraumatic.  Eyes:     Extraocular Movements: Extraocular movements intact.     Conjunctiva/sclera: Conjunctivae normal.  Cardiovascular:     Rate and Rhythm: Normal rate and regular rhythm.     Pulses: Normal pulses.  Pulmonary:     Effort: Pulmonary effort is normal. No respiratory distress.     Breath sounds: No wheezing.     Comments: Crackles appreciated at the bases Abdominal:     General: Bowel sounds are normal. There is no distension.     Palpations: Abdomen is soft.     Tenderness: There is no abdominal tenderness.  Musculoskeletal:        General: No swelling or tenderness.     Cervical back: Normal range of motion and neck supple.  Skin:    General: Skin is warm and dry.  Neurological:     General: No focal deficit present.     Mental Status: She is alert and oriented to person, place, and time.     Labs on Admission: I have personally reviewed following labs and imaging studies  CBC: Recent Labs  Lab 01/28/20 1730  WBC 3.8*  NEUTROABS 3.2  HGB 13.1  HCT 39.7  MCV 88.2  PLT 200   Basic Metabolic Panel: Recent Labs  Lab 01/28/20 1730  NA 135  K 3.1*  CL 99  CO2 25  GLUCOSE 112*  BUN 7  CREATININE 0.56  CALCIUM 8.1*   GFR: Estimated Creatinine Clearance: 107 mL/min (by C-G formula based on SCr of 0.56 mg/dL). Liver Function Tests: Recent Labs  Lab 01/28/20 1730  AST 37  ALT 30  ALKPHOS 74  BILITOT 0.5  PROT 7.4  ALBUMIN 3.2*   No results for input(s): LIPASE, AMYLASE in the last 168 hours. No results for input(s): AMMONIA in the last 168 hours. Coagulation Profile: No results for input(s): INR, PROTIME in the last 168  hours. Cardiac Enzymes: No results for input(s): CKTOTAL, CKMB, CKMBINDEX, TROPONINI in the last 168 hours. BNP (last 3 results) No results for input(s): PROBNP in the last 8760 hours. HbA1C: No results for input(s): HGBA1C in the last 72 hours. CBG: No results for input(s): GLUCAP in the last 168 hours. Lipid Profile: Recent Labs    01/28/20 1730  TRIG 66   Thyroid Function Tests: No results for input(s): TSH, T4TOTAL, FREET4, T3FREE, THYROIDAB in the last 72 hours. Anemia Panel: Recent Labs    01/28/20 1730  FERRITIN 298  Urine analysis:    Component Value Date/Time   COLORURINE YELLOW 01/28/2020 2014   APPEARANCEUR CLEAR 01/28/2020 2014   LABSPEC 1.020 01/28/2020 2014   PHURINE 7.5 01/28/2020 2014   GLUCOSEU NEGATIVE 01/28/2020 2014   HGBUR TRACE (A) 01/28/2020 2014   BILIRUBINUR NEGATIVE 01/28/2020 2014   KETONESUR 15 (A) 01/28/2020 2014   PROTEINUR NEGATIVE 01/28/2020 2014   NITRITE NEGATIVE 01/28/2020 2014   LEUKOCYTESUR NEGATIVE 01/28/2020 2014    Radiological Exams on Admission: DG Chest Portable 1 View  Result Date: 01/28/2020 CLINICAL DATA:  Hypoxia short of breath EXAM: PORTABLE CHEST 1 VIEW COMPARISON:  None. FINDINGS: Low lung volumes. Patchy left greater than right basilar opacity. No pleural effusion or pneumothorax. IMPRESSION: Low lung volumes with patchy left greater than right basilar atelectasis or pneumonia. Electronically Signed   By: Jasmine Pang M.D.   On: 01/28/2020 18:01    EKG: Independently reviewed.  Sinus tachycardia, T wave abnormality in inferior and lateral leads.  No prior tracing for comparison.  Assessment/Plan Principal Problem:   Pneumonia due to COVID-19 virus Active Problems:   Acute hypoxemic respiratory failure (HCC)   Sepsis (HCC)   Hypokalemia   Abnormal EKG   Acute hypoxemic respiratory failure Sepsis COVID-19 viral pneumonia SPO2 85% on room air, currently requiring 1 L supplemental oxygen to maintain sats  above 90%.  Meets sepsis criteria - 2 SIRS (slight tachycardia and tachypnea in the ED) and Covid positive with evidence of pneumonia on chest x-ray.  No lactic acidosis or hypotension at present to suggest severe sepsis.  Inflammatory markers slightly elevated: D-dimer 0.78, LDH 257, CRP 1.3.  Bacterial pneumonia less likely given no leukocytosis and procalcitonin <0.10. -CT angiogram chest was ordered in the ED to rule out PE.  Patient states the study could not be done as she is allergic to IV contrast and has not been able to tolerate it in the past.  Although PE is a possibility, suspicion is low at this time as D-dimer is only mildly elevated in the setting of acute viral illness.  Patient appears comfortable on 1 L supplemental oxygen and not tachycardic or significantly tachypneic at present.  Will order full dose Lovenox for now and repeat D-dimer in the morning, if significantly worse, then consider obtaining VQ scan to rule out PE.  Will also order bilateral lower extremity Dopplers to rule out DVT.  Discussed plan with the patient and she agrees.  -Remdesivir -IV Solu-Medrol 0.5 mg/kg every 12 hours -Vitamin C, zinc, vitamin D - Robitussin-DM for cough as needed -Tylenol as needed -Bronchodilator as needed -Daily CBC with differential, CMP, CRP, D-dimer -Airborne and contact precautions -Incentive spirometry, flutter valve -Encourage prone positioning -Continuous pulse ox -Supplemental oxygen as needed to keep oxygen saturation above 90% -Blood culture x2 pending  Mild hypokalemia Likely due to poor oral intake in the setting of acute viral illness. -Received oral potassium supplement in the ED.  Check magnesium level and replete if low.  Continue to monitor electrolytes.  Abnormal EKG EKG showing T wave abnormality in inferior and lateral leads; no prior tracing for comparison.  Patient is not endorsing chest pain. -Cardiac monitoring.  Check high-sensitivity troponin level.  Order  echocardiogram.  Anxiety -Resume home meds after pharmacy med rec is done.  Morbid obesity (BMI 45.42) -Encourage lifestyle modifications including healthy eating and exercise for weight loss after patient recovers from her acute viral illness.  History of thyroid disease Not on any medications. -Check TSH  DVT prophylaxis:  Lovenox Code Status: Full code Family Communication: No family available at this time. Disposition Plan: Status is: Inpatient  Remains inpatient appropriate because:IV treatments appropriate due to intensity of illness or inability to take PO, Inpatient level of care appropriate due to severity of illness and New supplemental oxygen requirement   Dispo: The patient is from: Home              Anticipated d/c is to: Home              Anticipated d/c date is: 3 days              Patient currently is not medically stable to d/c.  The medical decision making on this patient was of high complexity and the patient is at high risk for clinical deterioration, therefore this is a level 3 visit.  John GiovanniVasundhra Shamira Toutant MD [facility]  If 7PM-7AM, please contact night-coverage [email or web address]  01/29/2020, 12:38 AM

## 2020-01-29 NOTE — Progress Notes (Signed)
Paged on call IR for pt's VQ scan.  Pt c/o of gas bubbles in her abdomen. MD made aware, PRN available. Pt complains of anxiety, PRN xanax available 2x daily per MD order. [place]

## 2020-01-29 NOTE — Progress Notes (Signed)
Lower extremity venous bilateral study completed.   Please see CV Proc for preliminary results.   Lalia Loudon, RDMS  

## 2020-01-30 DIAGNOSIS — U071 COVID-19: Secondary | ICD-10-CM | POA: Diagnosis not present

## 2020-01-30 DIAGNOSIS — J1282 Pneumonia due to coronavirus disease 2019: Secondary | ICD-10-CM | POA: Diagnosis not present

## 2020-01-30 LAB — COMPREHENSIVE METABOLIC PANEL
ALT: 45 U/L — ABNORMAL HIGH (ref 0–44)
AST: 41 U/L (ref 15–41)
Albumin: 3.1 g/dL — ABNORMAL LOW (ref 3.5–5.0)
Alkaline Phosphatase: 69 U/L (ref 38–126)
Anion gap: 10 (ref 5–15)
BUN: 12 mg/dL (ref 6–20)
CO2: 25 mmol/L (ref 22–32)
Calcium: 8.5 mg/dL — ABNORMAL LOW (ref 8.9–10.3)
Chloride: 105 mmol/L (ref 98–111)
Creatinine, Ser: 0.55 mg/dL (ref 0.44–1.00)
GFR, Estimated: >60 mL/min (ref >60)
Glucose, Bld: 108 mg/dL — ABNORMAL HIGH (ref 70–99)
Potassium: 3.8 mmol/L (ref 3.5–5.1)
Sodium: 140 mmol/L (ref 135–145)
Total Bilirubin: 0.6 mg/dL (ref 0.3–1.2)
Total Protein: 6.9 g/dL (ref 6.5–8.1)

## 2020-01-30 LAB — CBC WITH DIFFERENTIAL/PLATELET
Abs Immature Granulocytes: 0.01 10*3/uL (ref 0.00–0.07)
Basophils Absolute: 0 10*3/uL (ref 0.0–0.1)
Basophils Relative: 0 %
Eosinophils Absolute: 0 10*3/uL (ref 0.0–0.5)
Eosinophils Relative: 0 %
HCT: 38.6 % (ref 36.0–46.0)
Hemoglobin: 12.5 g/dL (ref 12.0–15.0)
Immature Granulocytes: 0 %
Lymphocytes Relative: 17 %
Lymphs Abs: 0.7 10*3/uL (ref 0.7–4.0)
MCH: 29.3 pg (ref 26.0–34.0)
MCHC: 32.4 g/dL (ref 30.0–36.0)
MCV: 90.6 fL (ref 80.0–100.0)
Monocytes Absolute: 0.3 10*3/uL (ref 0.1–1.0)
Monocytes Relative: 7 %
Neutro Abs: 3.1 10*3/uL (ref 1.7–7.7)
Neutrophils Relative %: 76 %
Platelets: 220 10*3/uL (ref 150–400)
RBC: 4.26 MIL/uL (ref 3.87–5.11)
RDW: 14 % (ref 11.5–15.5)
WBC: 4.2 10*3/uL (ref 4.0–10.5)
nRBC: 0 % (ref 0.0–0.2)

## 2020-01-30 LAB — C-REACTIVE PROTEIN: CRP: 0.7 mg/dL (ref <1.0)

## 2020-01-30 LAB — D-DIMER, QUANTITATIVE: D-Dimer, Quant: 0.5 ug/mL-FEU (ref 0.00–0.50)

## 2020-01-30 MED ORDER — ALBUTEROL SULFATE HFA 108 (90 BASE) MCG/ACT IN AERS: 2.0000 | INHALATION_SPRAY | RESPIRATORY_TRACT | 0 refills | Status: DC | PRN

## 2020-01-30 MED ORDER — DEXAMETHASONE 6 MG PO TABS: 6.0000 mg | ORAL_TABLET | Freq: Every day | ORAL | 0 refills | Status: AC

## 2020-01-30 MED ORDER — VITAMIN D3 25 MCG PO TABS: 1000.0000 [IU] | ORAL_TABLET | Freq: Every day | ORAL | 0 refills | Status: AC

## 2020-01-30 MED ORDER — ZINC SULFATE 220 (50 ZN) MG PO CAPS: 220.0000 mg | ORAL_CAPSULE | Freq: Every day | ORAL | 0 refills | Status: AC

## 2020-01-30 MED ORDER — ASCORBIC ACID 500 MG PO TABS: 500.0000 mg | ORAL_TABLET | Freq: Every day | ORAL | 0 refills | Status: AC

## 2020-01-30 MED ORDER — ENOXAPARIN SODIUM 60 MG/0.6ML ~~LOC~~ SOLN: 55.0000 mg | SUBCUTANEOUS | Status: DC

## 2020-01-30 NOTE — Progress Notes (Signed)
Patient scheduled for outpatient Remdesivir infusions at 11am on Monday 11/29 and Tuesday 11/30 at [facility]. Please inform the patient to park at [address], [place], as staff will be escorting the patient through the east entrance of the hospital. Appointments take approximately 45 minutes.  °  °There is a wave flag banner located near the entrance on [address] Turn into this entrance and immediately turn left or right and park in 1 of the 10 designated Covid Infusion Parking spots. There is a phone number on the sign, please call and let the staff know what spot you are in and we will come out and get you. For questions call [phone].  Thanks. ° ° ° °

## 2020-01-30 NOTE — Progress Notes (Signed)
SATURATION QUALIFICATIONS: (This note is used to comply with regulatory documentation for home oxygen)  Patient Saturations on Room Air at Rest = 94%  Patient Saturations on Room Air while Ambulating = 93%  Patient Saturations on 0 Liters of oxygen while Ambulating =   Please briefly explain why patient needs home oxygen: Patient doesn't need O2 to maintain O2 sats of 93%

## 2020-01-30 NOTE — Evaluation (Signed)
Physical Therapy Evaluation Patient Details Name: Adrienne Summers MRN: [id number] DOB: November 06, 1970 Today's Date: 01/30/2020   History of Present Illness  49 yo F with hx anxiety and panic disorder, endometriosis, fibromyalgia, GERD, IBS, interstitial cystitis, morbid obesity, thyroid disease who presented to the ED with weakness and URI symptoms. Covid +, admitted for pna  Clinical Impression  Patient evaluated by Physical Therapy with no further acute PT needs identified. All education has been completed and the patient has no further questions. See below for any follow-up Physical Therapy or equipment needs. PT is signing off. Thank you for this referral.  PT educated on use of IS and importance of continuation. Reviewed activity progression, normalcy of fatigue/DOE, importance of incremental incr in activity after d/c home. Pt verbalizes understanding. Instructed in  Sit<>stands and APs for HEP   Follow Up Recommendations No PT follow up    Equipment Recommendations  None recommended by PT    Recommendations for Other Services       Precautions / Restrictions Precautions Precautions: None      Mobility  Bed Mobility Overal bed mobility: Independent                  Transfers Overall transfer level: Modified independent               General transfer comment: incr time  Ambulation/Gait Ambulation/Gait assistance: Supervision Gait Distance (Feet): 60 Feet (20'+40') Assistive device: None Gait Pattern/deviations: Step-through pattern     General Gait Details: amb distance in room. SpO2=91-97% on RA  Stairs            Wheelchair Mobility    Modified Rankin (Stroke Patients Only)       Balance Overall balance assessment: Independent                                           Pertinent Vitals/Pain Pain Assessment: No/denies pain    Home Living Family/patient expects to be discharged to:: Private residence Living  Arrangements: Spouse/significant other;Children;Other relatives Available Help at Discharge: Family Type of Home: House Home Access: Stairs to enter   Secretary/administrator of Steps: ~5 Home Layout: One level Home Equipment: None      Prior Function Level of Independence: Independent               Hand Dominance        Extremity/Trunk Assessment   Upper Extremity Assessment Upper Extremity Assessment: Overall WFL for tasks assessed    Lower Extremity Assessment Lower Extremity Assessment: Overall WFL for tasks assessed       Communication   Communication: No difficulties  Cognition Arousal/Alertness: Awake/alert Behavior During Therapy: WFL for tasks assessed/performed Overall Cognitive Status: Within Functional Limits for tasks assessed                                        General Comments      Exercises     Assessment/Plan    PT Assessment Patent does not need any further PT services  PT Problem List         PT Treatment Interventions      PT Goals (Current goals can be found in the Care Plan section)  Acute Rehab PT Goals Patient Stated Goal: home today PT Goal Formulation:  All assessment and education complete, DC therapy    Frequency     Barriers to discharge        Co-evaluation               AM-PAC PT "6 Clicks" Mobility  Outcome Measure Help needed turning from your back to your side while in a flat bed without using bedrails?: None Help needed moving from lying on your back to sitting on the side of a flat bed without using bedrails?: None Help needed moving to and from a bed to a chair (including a wheelchair)?: None Help needed standing up from a chair using your arms (e.g., wheelchair or bedside chair)?: None Help needed to walk in hospital room?: None Help needed climbing 3-5 steps with a railing? : A Little 6 Click Score: 23    End of Session   Activity Tolerance: Patient tolerated treatment  well Patient left: in bed;with call bell/phone within reach;with bed alarm set Nurse Communication: Mobility status PT Visit Diagnosis: Other abnormalities of gait and mobility (R26.89)    Time: 4650-3546 PT Time Calculation (min) (ACUTE ONLY): 36 min   Charges:   PT Evaluation $PT Eval Low Complexity: 1 Low PT Treatments $Gait Training: 8-22 mins        Delice Bison, PT  Acute Rehab Dept ([facility]) [phone] Pager [phone]  01/30/2020   [facility] 01/30/2020, 12:29 PM

## 2020-01-30 NOTE — Discharge Instructions (Signed)
You are scheduled for an outpatient Remdesivir infusion at 11am on Monday 11/29 and Tuesday 11/30 at [facility]. Please park at [address] [place], [place], as staff will be escorting you through the east entrance of the hospital. Appointments take approximately 45 minutes.    The address for the infusion clinic site is:  --GPS address is [address] - the parking is located near [employer] building where you will see  COVID19 Infusion feather banner marking the entrance to parking.   (see photos below)            --Enter into the 2nd entrance where the "wave, flag banner" is at the road. Turn into this 2nd entrance and immediately turn left to park in 1 of the 5 parking spots.   --Please stay in your car and call the desk for assistance inside [phone].   The day of your visit you should:  Get plenty of rest the night before and drink plenty of water  Eat Adrienne Summers light meal/snack before coming and take your medications as prescribed   Wear warm, comfortable clothes with Adrienne Summers shirt that can roll-up over the elbow (will need IV start).   Wear Adrienne Summers mask   Consider bringing some activity to help pass the time     Person Under Monitoring Name: Adrienne Summers  Location: [address] [place] [address]   Infection Prevention Recommendations for Individuals Confirmed to have, or Being Evaluated for, 2019 Novel Coronavirus (COVID-19) Infection Who Receive Care at Home  Individuals who are confirmed to have, or are being evaluated for, COVID-19 should follow the prevention steps below until Adrienne Summers healthcare provider or local or state health department says they can return to normal activities.  Stay home except to get medical care You should restrict activities outside your home, except for getting medical care. Do not go to work, school, or public areas, and do not use public transportation or taxis.  Call ahead before visiting your doctor Before your  medical appointment, call the healthcare provider and tell them that you have, or are being evaluated for, COVID-19 infection. This will help the healthcare providers office take steps to keep other people from getting infected. Ask your healthcare provider to call the local or state health department.  Monitor your symptoms Seek prompt medical attention if your illness is worsening (e.g., difficulty breathing). Before going to your medical appointment, call the healthcare provider and tell them that you have, or are being evaluated for, COVID-19 infection. Ask your healthcare provider to call the local or state health department.  Wear Adrienne Summers facemask You should wear Adrienne Summers facemask that covers your nose and mouth when you are in the same room with other people and when you visit Adrienne Summers healthcare provider. People who live with or visit you should also wear Adrienne Summers facemask while they are in the same room with you.  Separate yourself from other people in your home As much as possible, you should stay in Adrienne Summers different room from other people in your home. Also, you should use Adrienne Summers separate bathroom, if available.  Avoid sharing household items You should not share dishes, drinking glasses, cups, eating utensils, towels, bedding, or other items with other people in your home. After using these items, you should wash them thoroughly with soap and water.  Cover your coughs and sneezes Cover your mouth and nose with Adrienne Summers tissue when you cough or sneeze, or you can cough or sneeze into your  sleeve. Throw used tissues in Adrienne Summers lined trash can, and immediately wash your hands with soap and water for at least 20 seconds or use an alcohol-based hand rub.  Wash your Adrienne Summers your hands often and thoroughly with soap and water for at least 20 seconds. You can use an alcohol-based hand sanitizer if soap and water are not available and if your hands are not visibly dirty. Avoid touching your eyes, nose, and mouth with unwashed  hands.   Prevention Steps for Caregivers and Household Members of Individuals Confirmed to have, or Being Evaluated for, COVID-19 Infection Being Cared for in the Home  If you live with, or provide care at home for, Adrienne Summers person confirmed to have, or being evaluated for, COVID-19 infection please follow these guidelines to prevent infection:  Follow healthcare providers instructions Make sure that you understand and can help the patient follow any healthcare provider instructions for all care.  Provide for the patients basic needs You should help the patient with basic needs in the home and provide support for getting groceries, prescriptions, and other personal needs.  Monitor the patients symptoms If they are getting sicker, call his or her medical provider and tell them that the patient has, or is being evaluated for, COVID-19 infection. This will help the healthcare providers office take steps to keep other people from getting infected. Ask the healthcare provider to call the local or state health department.  Limit the number of people who have contact with the patient  If possible, have only one caregiver for the patient.  Other household members should stay in another home or place of residence. If this is not possible, they should stay  in another room, or be separated from the patient as much as possible. Use Adrienne Summers separate bathroom, if available.  Restrict visitors who do not have an essential need to be in the home.  Keep older adults, very young children, and other sick people away from the patient Keep older adults, very young children, and those who have compromised immune systems or chronic health conditions away from the patient. This includes people with chronic heart, lung, or kidney conditions, diabetes, and cancer.  Ensure good ventilation Make sure that shared spaces in the home have good air flow, such as from an air conditioner or an opened window, weather  permitting.  Wash your hands often  Wash your hands often and thoroughly with soap and water for at least 20 seconds. You can use an alcohol based hand sanitizer if soap and water are not available and if your hands are not visibly dirty.  Avoid touching your eyes, nose, and mouth with unwashed hands.  Use disposable paper towels to dry your hands. If not available, use dedicated cloth towels and replace them when they become wet.  Wear Adrienne Summers facemask and gloves  Wear Adrienne Summers disposable facemask at all times in the room and gloves when you touch or have contact with the patients blood, body fluids, and/or secretions or excretions, such as sweat, saliva, sputum, nasal mucus, vomit, urine, or feces.  Ensure the mask fits over your nose and mouth tightly, and do not touch it during use.  Throw out disposable facemasks and gloves after using them. Do not reuse.  Wash your hands immediately after removing your facemask and gloves.  If your personal clothing becomes contaminated, carefully remove clothing and launder. Wash your hands after handling contaminated clothing.  Place all used disposable facemasks, gloves, and other waste in Givanni Staron lined  container before disposing them with other household waste.  Remove gloves and wash your hands immediately after handling these items.  Do not share dishes, glasses, or other household items with the patient  Avoid sharing household items. You should not share dishes, drinking glasses, cups, eating utensils, towels, bedding, or other items with Kyheem Bathgate patient who is confirmed to have, or being evaluated for, COVID-19 infection.  After the person uses these items, you should wash them thoroughly with soap and water.  Wash laundry thoroughly  Immediately remove and wash clothes or bedding that have blood, body fluids, and/or secretions or excretions, such as sweat, saliva, sputum, nasal mucus, vomit, urine, or feces, on them.  Wear gloves when handling laundry from  the patient.  Read and follow directions on labels of laundry or clothing items and detergent. In general, wash and dry with the warmest temperatures recommended on the label.  Clean all areas the individual has used often  Clean all touchable surfaces, such as counters, tabletops, doorknobs, bathroom fixtures, toilets, phones, keyboards, tablets, and bedside tables, every day. Also, clean any surfaces that may have blood, body fluids, and/or secretions or excretions on them.  Wear gloves when cleaning surfaces the patient has come in contact with.  Use Aleyssa Pike diluted bleach solution (e.g., dilute bleach with 1 part bleach and 10 parts water) or Alishba Naples household disinfectant with [employer] label that says EPA-registered for coronaviruses. To make Maurion [place]ak bleach solution at home, add 1 tablespoon of bleach to 1 quart (4 cups) of water. For Anahli Arvanitis larger supply, add  cup of bleach to 1 gallon (16 cups) of water.  Read labels of cleaning products and follow recommendations provided on product labels. Labels contain instructions for safe and effective use of the cleaning product including precautions you should take when applying the product, such as wearing gloves or eye protection and making sure you have good ventilation during use of the product.  Remove gloves and wash hands immediately after cleaning.  Monitor yourself for signs and symptoms of illness Caregivers and household members are considered close contacts, should monitor their health, and will be asked to limit movement outside of the home to the extent possible. Follow the monitoring steps for close contacts listed on the symptom monitoring form.   ? If you have additional questions, contact your local health department or call the epidemiologist on call at [phone] (available 24/7). ? This guidance is subject to change. For the most up-to-date guidance from [facility], please refer to their  website: [employer]

## 2020-01-30 NOTE — Progress Notes (Signed)
Pharmacy: Lovenox   Patient's a 49 y.o F currently hospitalized with COVID-19.  She was placed on lovenox 0.5 mg/kg/day (adjusted dose for high BMI and COVID+) on admission for VTE prophylaxis. With concern for acute VTE, lovenox changed to full dose on 11/27.  LE doppler on 11/27 was negative.  Pharmacy is consulted on 11/28 to transition patient back to VTE prophylaxis dose.  Plan: - lovenox 0.5 mg/kg/day - pharmacy will sign off. Re-consult [place] if need further assistance  Dorna Leitz, PharmD, BCPS 01/30/2020 12:54 PM

## 2020-01-30 NOTE — Discharge Summary (Addendum)
Physician Discharge Summary  Adrienne Summers OQH:[id number] DOB: 07-24-1970 DOA: 01/28/2020  PCP: Leamon Arnt, MD  Admit date: 01/28/2020 Discharge date: 01/30/2020  Time spent: 40 minutes  Recommendations for Outpatient Follow-up:  1. Follow outpatient CBC/CMP 2. Continue isolation per CDC protocol - ok for isolation to d/c after Dec 17 if she's continued to improve 3. Follow abnormal EKG outpatient 4. Follow CXR in 3-4 weeks   Discharge Diagnoses:  Principal Problem:   Pneumonia due to COVID-19 virus Active Problems:   Acute hypoxemic respiratory failure ([place])   Sepsis ([place])   Hypokalemia   Abnormal EKG   Discharge Condition: stable   Diet recommendation: heart healthy  Filed Weights   01/28/20 1552 01/28/20 1553 01/29/20 0009  Weight: 113.4 kg 116.3 kg 114 kg    History of present illness:  49 yo F with hx anxiety and panic disorder, endometriosis, fibromyalgia, GERD, IBS, interstitial cystitis, morbid obesity, thyroid disease who presented to the ED with weakness and URI symptoms.  About 3 weeks of cough, worse over the past week.  Admitted for COVID 19 pneumonia treatment.   She's improved on steroids and remdesivir.  She's on room air now.  Plan for outpatient steroids and complete remdesivir outpatient.   Hospital Course:  Acute hypoxemic respiratory failure COVID-19 viral pneumonia Sepsis Met criteria for sepsis based on evaluation by admitting physician (tachycardic and tachypneic with covid pneumonia)  CXR 11/26 with low lung volumes with patchy L greater than R basilar atelectasis or pneumonia Currently satting in the low 90's on 1 L - wean for goal > 88% Procal <0.1, low suspicion for bacterial infection  Started on therapeutic anticoagulation with D dimer 0.78, suspect this is due to inflammation from covid, lower suspicion at this time for VTE -> trend d dimer and follow LE [place] -> negative LE [place], D dimer normalized today.  Low suspicion for VTE.   Satting well on RA today, discharge with steroids and remdesivir to complete outpatient Strict I/O, daily weights Prone as able, OOB, IS, flutter  COVID-19 Labs  Recent Labs    01/28/20 1730 01/29/20 0833 01/30/20 0553  DDIMER 0.78* 0.69* 0.50  FERRITIN 298  --   --   LDH 257*  --   --   CRP 1.3*  --  0.7    Lab Results  Component Value Date   SARSCOV2NAA POSITIVE (A) 01/28/2020   Mild hypokalemia Replace and follow   Abnormal EKG  S1Q3T3 Pattern - abnormal EKG.  D dimer normalized today - echo with normal EF, no RWMA, normal RVSF - follow outpatient   Anxiety Reports she's on xanax for anxiety, will continue for now (not on PMP aware)  Morbid obesity (BMI 45.42) -Encourage lifestyle modifications including healthy eating and exercise for weight loss after patient recovers from her acute viral illness.  History of thyroid disease Not on any medications. -Check TSH - wnl  Procedures: Echo IMPRESSIONS    1. Left ventricular ejection fraction, by estimation, is 60 to 65%. The  left ventricle has normal function. The left ventricle has no regional  wall motion abnormalities. Left ventricular diastolic parameters were  normal.  2. Right ventricular systolic function is normal. The right ventricular  size is normal.  3. The mitral valve is normal in structure. No evidence of mitral valve  regurgitation. No evidence of mitral stenosis.  4. The aortic valve is normal in structure. Aortic valve regurgitation is  not visualized. No aortic stenosis is present.  5.  The inferior vena cava is normal in size with greater than 50%  respiratory variability, suggesting right atrial pressure of 3 mmHg.  LE [place] Summary:  RIGHT:  - There is no evidence of deep vein thrombosis in the lower extremity.    - No cystic structure found in the popliteal fossa.    LEFT:  - There is no evidence of deep vein thrombosis in the lower extremity.    - No cystic structure  found in the popliteal fossa.  Consultations:  none  Discharge Exam: Vitals:   01/29/20 2103 01/30/20 0635  BP: 125/86 110/73  Pulse: 89 79  Resp: 20 20  Temp: 98.4 F (36.9 C) 99.1 F (37.3 C)  SpO2: 92% 100%   NAD, feeling better, but still weak Ok with d/c home today Discussed with husband  General: No acute distress. Cardiovascular: Heart sounds show Emeril Stille regular rate, and rhythm. Distant breath sounds, unlabored Abdomen: Soft, nontender, nondistended Neurological: Alert and oriented 3. Moves all extremities 4. Cranial nerves II through XII grossly intact. Skin: Warm and dry. No rashes or lesions. Extremities: No clubbing or cyanosis. No edema   Discharge Instructions   Discharge Instructions    Call MD for:  difficulty breathing, headache or visual disturbances   Complete by: As directed    Call MD for:  extreme fatigue   Complete by: As directed    Call MD for:  hives   Complete by: As directed    Call MD for:  persistant dizziness or light-headedness   Complete by: As directed    Call MD for:  persistant nausea and vomiting   Complete by: As directed    Call MD for:  redness, tenderness, or signs of infection (pain, swelling, redness, odor or green/yellow discharge around incision site)   Complete by: As directed    Call MD for:  severe uncontrolled pain   Complete by: As directed    Call MD for:  temperature >100.4   Complete by: As directed    Diet - low sodium heart healthy   Complete by: As directed    Discharge instructions   Complete by: As directed    You were seen for COVID pneumonia.  You've improved with steroids and remdesivir.  We'll discharge you with steroids for another 7 days and remdesivir to get at the outpatient infusion center.  Please continue to isolate for 21 days after your positive test.  Your isolation can be discontinued on December 17th.    Return for new, recurrent, or worsening symptoms.  Please ask your PCP to request  records from this hospitalization so they know what was done and what the next steps will be.   Increase activity slowly   Complete by: As directed      Allergies as of 01/30/2020      Reactions   Contrast Media [iodinated Diagnostic Agents] Hives, Itching   Codeine Nausea And Vomiting, Diarrhea   Tape Itching, Other (See Comments)   Adhesive Tape - Pulls skin off; makes skin red   Penicillins Rash   Sulfa Antibiotics Rash   Sulfamethoxazole Rash      Medication List    STOP taking these medications   azithromycin 250 MG tablet Commonly known as: ZITHROMAX   ibuprofen 200 MG tablet Commonly known as: ADVIL     TAKE these medications   albuterol 108 (90 Base) MCG/ACT inhaler Commonly known as: VENTOLIN HFA Inhale 2 puffs into the lungs every 4 (four) hours as needed  for wheezing or shortness of breath.   ALPRAZolam 0.25 MG tablet Commonly known as: XANAX Take 0.25 mg by mouth 3 (three) times daily as needed for anxiety.   ascorbic acid 500 MG tablet Commonly known as: VITAMIN C Take 1 tablet (500 mg total) by mouth daily. Start taking on: January 31, 2020   cetirizine 10 MG tablet Commonly known as: ZYRTEC Take 10 mg by mouth daily.   dexamethasone 6 MG tablet Commonly known as: Decadron Take 1 tablet (6 mg total) by mouth daily for 7 days.   sertraline 50 MG tablet Commonly known as: ZOLOFT TAKE 1 TABLET BY MOUTH EVERY DAY What changed: when to take this   Vitamin D3 25 MCG tablet Commonly known as: Vitamin D Take 1 tablet (1,000 Units total) by mouth daily. Start taking on: January 31, 2020   zinc sulfate 220 (50 Zn) MG capsule Take 1 capsule (220 mg total) by mouth daily. Start taking on: January 31, 2020      Allergies  Allergen Reactions  . Contrast Media [Iodinated Diagnostic Agents] Hives and Itching  . Codeine Nausea And Vomiting and Diarrhea  . Tape Itching and Other (See Comments)    Adhesive Tape - Pulls skin off; makes skin red  .  Penicillins Rash  . Sulfa Antibiotics Rash  . Sulfamethoxazole Rash      The results of significant diagnostics from this hospitalization (including imaging, microbiology, ancillary and laboratory) are listed below for reference.    Significant Diagnostic Studies: DG Chest Portable 1 View  Result Date: 01/28/2020 CLINICAL DATA:  Hypoxia short of breath EXAM: PORTABLE CHEST 1 VIEW COMPARISON:  None. FINDINGS: Low lung volumes. Patchy left greater than right basilar opacity. No pleural effusion or pneumothorax. IMPRESSION: Low lung volumes with patchy left greater than right basilar atelectasis or pneumonia. Electronically Signed   By: Donavan Foil M.D.   On: 01/28/2020 18:01   VAS [place] LOWER EXTREMITY VENOUS (DVT)  Result Date: 01/29/2020  Lower Venous DVT Study Indications: D-dimer, Covid.  Anticoagulation: Lovenox. Comparison Study: No prior studies. Performing Technologist: Darlin Coco, RDMS  Examination Guidelines: A complete evaluation includes B-mode imaging, spectral Doppler, color Doppler, and power Doppler as needed of all accessible portions of each vessel. Bilateral testing is considered an integral part of a complete examination. Limited examinations for reoccurring indications may be performed as noted. The reflux portion of the exam is performed with the patient in reverse Trendelenburg.  +---------+---------------+---------+-----------+----------+--------------+ RIGHT    CompressibilityPhasicitySpontaneityPropertiesThrombus Aging +---------+---------------+---------+-----------+----------+--------------+ CFV      Full           Yes      Yes                                 +---------+---------------+---------+-----------+----------+--------------+ SFJ      Full                                                        +---------+---------------+---------+-----------+----------+--------------+ FV Prox  Full                                                         +---------+---------------+---------+-----------+----------+--------------+  FV Mid   Full                                                        +---------+---------------+---------+-----------+----------+--------------+ FV DistalFull                                                        +---------+---------------+---------+-----------+----------+--------------+ PFV      Full                                                        +---------+---------------+---------+-----------+----------+--------------+ POP      Full           Yes      Yes                                 +---------+---------------+---------+-----------+----------+--------------+ PTV      Full                                                        +---------+---------------+---------+-----------+----------+--------------+ PERO     Full                                                        +---------+---------------+---------+-----------+----------+--------------+   +---------+---------------+---------+-----------+----------+--------------+ LEFT     CompressibilityPhasicitySpontaneityPropertiesThrombus Aging +---------+---------------+---------+-----------+----------+--------------+ CFV      Full           Yes      Yes                                 +---------+---------------+---------+-----------+----------+--------------+ SFJ      Full                                                        +---------+---------------+---------+-----------+----------+--------------+ FV Prox  Full                                                        +---------+---------------+---------+-----------+----------+--------------+ FV Mid   Full                                                        +---------+---------------+---------+-----------+----------+--------------+  FV DistalFull                                                         +---------+---------------+---------+-----------+----------+--------------+ PFV      Full                                                        +---------+---------------+---------+-----------+----------+--------------+ POP      Full           Yes      Yes                                 +---------+---------------+---------+-----------+----------+--------------+ PTV      Full                                                        +---------+---------------+---------+-----------+----------+--------------+ PERO     Full                                                        +---------+---------------+---------+-----------+----------+--------------+     Summary: RIGHT: - There is no evidence of deep vein thrombosis in the lower extremity.  - No cystic structure found in the popliteal fossa.  LEFT: - There is no evidence of deep vein thrombosis in the lower extremity.  - No cystic structure found in the popliteal fossa.  *See table(s) above for measurements and observations. Electronically signed by Jamelle Haring on 01/29/2020 at 1:53:35 PM.    Final    ECHOCARDIOGRAM LIMITED  Result Date: 01/29/2020    ECHOCARDIOGRAM LIMITED REPORT   Patient Name:   LAVANA HUCKEBA Filosa Date of Exam: 01/29/2020 Medical Rec #:  [id number]             Height:       63.0 in Accession #:    [id number]            Weight:       251.3 lb Date of Birth:  1970-05-01            BSA:          2.131 m Patient Age:    89 years              BP:           130/84 mmHg Patient Gender: F                     HR:           93 bpm. Exam Location:  Inpatient Procedure: Limited Echo, Limited Color Doppler, Cardiac Doppler and Intracardiac            Opacification Agent Indications:    Abnormal ECG 794.31 / R94.31  History:  Patient has no prior history of Echocardiogram examinations.                 Risk Factors:Non-Smoker. GERD.  Sonographer:    Vickie Epley RDCS Referring Phys: [id number] [facility]  Sonographer  Comments: Covid positive. IMPRESSIONS  1. Left ventricular ejection fraction, by estimation, is 60 to 65%. The left ventricle has normal function. The left ventricle has no regional wall motion abnormalities. Left ventricular diastolic parameters were normal.  2. Right ventricular systolic function is normal. The right ventricular size is normal.  3. The mitral valve is normal in structure. No evidence of mitral valve regurgitation. No evidence of mitral stenosis.  4. The aortic valve is normal in structure. Aortic valve regurgitation is not visualized. No aortic stenosis is present.  5. The inferior vena cava is normal in size with greater than 50% respiratory variability, suggesting right atrial pressure of 3 mmHg. FINDINGS  Left Ventricle: Left ventricular ejection fraction, by estimation, is 60 to 65%. The left ventricle has normal function. The left ventricle has no regional wall motion abnormalities. Definity contrast agent was given IV to delineate the left ventricular  endocardial borders. The left ventricular internal cavity size was normal in size. There is no left ventricular hypertrophy. Left ventricular diastolic parameters were normal. Normal left ventricular filling pressure. Right Ventricle: The right ventricular size is normal. No increase in right ventricular wall thickness. Right ventricular systolic function is normal. Left Atrium: Left atrial size was normal in size. Right Atrium: Right atrial size was normal in size. Pericardium: There is no evidence of pericardial effusion. Mitral Valve: The mitral valve is normal in structure. No evidence of mitral valve stenosis. Tricuspid Valve: The tricuspid valve is normal in structure. Tricuspid valve regurgitation is not demonstrated. No evidence of tricuspid stenosis. Aortic Valve: The aortic valve is normal in structure. Aortic valve regurgitation is not visualized. No aortic stenosis is present. Pulmonic Valve: The pulmonic valve was normal in  structure. Pulmonic valve regurgitation is not visualized. No evidence of pulmonic stenosis. Aorta: The aortic root is normal in size and structure. Venous: The inferior vena cava is normal in size with greater than 50% respiratory variability, suggesting right atrial pressure of 3 mmHg. IAS/Shunts: No atrial level shunt detected by color flow Doppler. LEFT VENTRICLE PLAX 2D LVIDd:         4.00 cm     Diastology LVIDs:         2.90 cm     LV e' medial:    6.09 cm/s LV PW:         0.80 cm     LV E/e' medial:  11.5 LV IVS:        0.80 cm     LV e' lateral:   10.60 cm/s LVOT diam:     1.50 cm     LV E/e' lateral: 6.6 LV SV:         30 LV SV Index:   14 LVOT Area:     1.77 cm  LV Volumes (MOD) LV vol d, MOD A2C: 82.3 ml LV vol d, MOD A4C: 85.5 ml LV vol s, MOD A2C: 32.6 ml LV vol s, MOD A4C: 33.0 ml LV SV MOD A2C:     49.7 ml LV SV MOD A4C:     85.5 ml LV SV MOD BP:      53.9 ml LEFT ATRIUM         Index LA diam:    3.70 cm  1.74 cm/m  AORTIC VALVE LVOT Vmax:   98.60 cm/s LVOT Vmean:  66.000 cm/s LVOT VTI:    0.171 m  AORTA Ao Root diam: 2.70 cm MITRAL VALVE MV Area (PHT): 4.39 cm    SHUNTS MV Decel Time: 173 msec    Systemic VTI:  0.17 m MV E velocity: 70.30 cm/s  Systemic Diam: 1.50 cm MV A velocity: 53.80 cm/s MV E/A ratio:  1.31 Mihai Croitoru MD Electronically signed by Sanda Klein MD Signature Date/Time: 01/29/2020/1:00:40 PM    Final     Microbiology: Recent Results (from the past 240 hour(s))  Resp Panel by RT-PCR (Flu A&B, Covid) Nasopharyngeal Swab     Status: Abnormal   Collection Time: 01/28/20  4:01 PM   Specimen: Nasopharyngeal Swab; Nasopharyngeal(NP) swabs in vial transport medium  Result Value Ref Range Status   SARS Coronavirus 2 by RT PCR POSITIVE (A) NEGATIVE Final    Comment: RESULT CALLED TO, READ BACK BY AND VERIFIED WITH: MARVA SIMMS RN AT 7341 ON 01/28/20 BY I.[email or web address] (NOTE) SARS-CoV-2 target nucleic acids are DETECTED.  The SARS-CoV-2 RNA is generally detectable in upper  respiratory specimens during the acute phase of infection. Positive results are indicative of the presence of the identified virus, but do not rule out bacterial infection or co-infection with other pathogens not detected by the test. Clinical correlation with patient history and other diagnostic information is necessary to determine patient infection status. The expected result is Negative.  Fact Sheet for Patients: EntrepreneurPulse.com.au  Fact Sheet for Healthcare Providers: IncredibleEmployment.be  This test is not yet approved or cleared by the [place] FDA and  has been authorized for detection and/or diagnosis of SARS-CoV-2 by FDA under an Emergency Use Authorization (EUA).  This EUA will remain in effect (meaning this  test can be used) for the duration of  the COVID-19 declaration under Section 564(b)(1) of the Act, 21 U.S.C. section 360bbb-3(b)(1), unless the authorization is terminated or revoked sooner.     Influenza A by PCR NEGATIVE NEGATIVE Final   Influenza B by PCR NEGATIVE NEGATIVE Final    Comment: (NOTE) The Xpert Xpress SARS-CoV-2/FLU/RSV plus assay is intended as an aid in the diagnosis of influenza from Nasopharyngeal swab specimens and should not be used as a sole basis for treatment. Nasal washings and aspirates are unacceptable for Xpert Xpress SARS-CoV-2/FLU/RSV testing.  Fact Sheet for Patients: EntrepreneurPulse.com.au  Fact Sheet for Healthcare Providers: IncredibleEmployment.be  This test is not yet approved or cleared by the [place] FDA and has been authorized for detection and/or diagnosis of SARS-CoV-2 by FDA under an Emergency Use Authorization (EUA). This EUA will remain in effect (meaning this test can be used) for the duration of the COVID-19 declaration under Section 564(b)(1) of the Act, 21 U.S.C. section 360bbb-3(b)(1), unless the authorization is  terminated or revoked.  Performed at [facility], [address] [place], [place] [address]   Blood Culture (routine x 2)     Status: None (Preliminary result)   Collection Time: 01/28/20  5:00 PM   Specimen: BLOOD  Result Value Ref Range Status   Specimen Description   Final    BLOOD LEFT ANTECUBITAL Performed at [facility], [address] [place], [place] [address]    Special Requests   Final    BOTTLES DRAWN AEROBIC AND ANAEROBIC Blood Culture adequate volume Performed at [facility], [address], [place], [place] [address]    Culture  Final    NO GROWTH 2 DAYS Performed at [facility], [place] [address] [place], [place] [address]    Report Status PENDING  Incomplete  Blood Culture (routine x 2)     Status: None (Preliminary result)   Collection Time: 01/28/20  7:12 PM   Specimen: BLOOD  Result Value Ref Range Status   Specimen Description   Final    BLOOD BLOOD RIGHT HAND Performed at [facility], [address] [place], [place] [address]    Special Requests   Final    BOTTLES DRAWN AEROBIC AND ANAEROBIC Blood Culture adequate volume Performed at [facility], [address] [place], [place] [address]    Culture   Final    NO GROWTH 2 DAYS Performed at [facility], [place] [address] [place], [place] [address]    Report Status PENDING  Incomplete     Labs: Basic Metabolic Panel: Recent Labs  Lab 01/28/20 1730 01/29/20 0119 01/29/20 0833 01/30/20 0553  NA 135  --  136 140  K 3.1*  --  3.2* 3.8  CL 99  --  104 105  CO2 25  --  23 25  GLUCOSE 112*  --  126* 108*  BUN 7  --  6 12  CREATININE 0.56  --  0.53 0.55  CALCIUM 8.1*  --  8.3* 8.5*  MG  --  1.9 2.0  --    Liver Function Tests: Recent Labs  Lab 01/28/20 1730 01/29/20 0833 01/30/20 0553  AST 37 26 41  ALT 30 27 45*  ALKPHOS 74 79 69  BILITOT 0.5 0.6 0.6  PROT 7.4 7.4 6.9  ALBUMIN 3.2* 3.3* 3.1*   No results for  input(s): LIPASE, AMYLASE in the last 168 hours. No results for input(s): AMMONIA in the last 168 hours. CBC: Recent Labs  Lab 01/28/20 1730 01/29/20 0833 01/30/20 0553  WBC 3.8* 1.9* 4.2  NEUTROABS 3.2 1.4* 3.1  HGB 13.1 13.1 12.5  HCT 39.7 40.0 38.6  MCV 88.2 89.1 90.6  PLT 200 213 220   Cardiac Enzymes: No results for input(s): CKTOTAL, CKMB, CKMBINDEX, TROPONINI in the last 168 hours. BNP: BNP (last 3 results) No results for input(s): BNP in the last 8760 hours.  ProBNP (last 3 results) No results for input(s): PROBNP in the last 8760 hours.  CBG: No results for input(s): GLUCAP in the last 168 hours.     Signed:  Fayrene Helper MD.  [facility] 01/30/2020, 12:50 PM

## 2020-01-31 ENCOUNTER — Ambulatory Visit (HOSPITAL_COMMUNITY)
Admit: 2020-01-31 | Discharge: 2020-01-31 | Disposition: A | Discharge status: Home / Self Care | Payer: 59 | Attending: Pulmonary Disease | Admitting: Pulmonary Disease

## 2020-01-31 ENCOUNTER — Telehealth: Payer: Self-pay

## 2020-01-31 DIAGNOSIS — U071 COVID-19: Secondary | ICD-10-CM | POA: Diagnosis not present

## 2020-01-31 MED ORDER — SODIUM CHLORIDE 0.9 % IV BOLUS
1000.0000 mL | Freq: Once | INTRAVENOUS | Status: AC
  Administered 2020-01-31: 1000 mL via INTRAVENOUS

## 2020-01-31 MED ORDER — EPINEPHRINE 0.3 MG/0.3ML IJ SOAJ: 0.3000 mg | Freq: Once | INTRAMUSCULAR | Status: DC | PRN

## 2020-01-31 MED ORDER — SODIUM CHLORIDE 0.9 % IV SOLN: INTRAVENOUS | Status: DC | PRN

## 2020-01-31 MED ORDER — METHYLPREDNISOLONE SODIUM SUCC 125 MG IJ SOLR: 125.0000 mg | Freq: Once | INTRAMUSCULAR | Status: DC | PRN

## 2020-01-31 MED ORDER — SODIUM CHLORIDE 0.9 % IV SOLN
100.0000 mg | Freq: Once | INTRAVENOUS | Status: AC
  Administered 2020-01-31: 100 mg via INTRAVENOUS

## 2020-01-31 MED ORDER — DIPHENHYDRAMINE HCL 50 MG/ML IJ SOLN: 50.0000 mg | Freq: Once | INTRAMUSCULAR | Status: DC | PRN

## 2020-01-31 MED ORDER — FAMOTIDINE IN NACL 20-0.9 MG/50ML-% IV SOLN: 20.0000 mg | Freq: Once | INTRAVENOUS | Status: DC | PRN

## 2020-01-31 MED ORDER — ALBUTEROL SULFATE HFA 108 (90 BASE) MCG/ACT IN AERS: 2.0000 | INHALATION_SPRAY | Freq: Once | RESPIRATORY_TRACT | Status: DC | PRN

## 2020-01-31 NOTE — Progress Notes (Signed)
Note: Pt notes feeling anxious and about to "pass out"; elevated pt legs and lowered back of chair with ice to neck.  Pt did well; no loss of consciousness.  Diagnosis: COVID-19  Physician: Dr. Shan Levans   Procedure:   Medication fact sheet provided to patient; all questions answered.  Allergies reviewed with patient.  IV placed.  Remdesivir administered via IV infusion.   Complications: No immediate complications noted.  Discharge: Discharged home   Gregary Signs 01/31/2020

## 2020-01-31 NOTE — Telephone Encounter (Cosign Needed)
Transition Care Management Unsuccessful Follow-up Telephone Call  Date of discharge and from where:  01/30/20 from [facility]  Attempts:  1st Attempt  Reason for unsuccessful TCM follow-up call:  Voice mail full

## 2020-01-31 NOTE — Discharge Instructions (Signed)
10 Things You Can Do to Manage Your COVID-19 Symptoms at Home If you have possible or confirmed COVID-19: 1. Stay home from work and school. And stay away from other public places. If you must go out, avoid using any kind of public transportation, ridesharing, or taxis. 2. Monitor your symptoms carefully. If your symptoms get worse, call your healthcare provider immediately. 3. Get rest and stay hydrated. 4. If you have a medical appointment, call the healthcare provider ahead of time and tell them that you have or may have COVID-19. 5. For medical emergencies, call 911 and notify the dispatch personnel that you have or may have COVID-19. 6. Cover your cough and sneezes with a tissue or use the inside of your elbow. 7. Wash your hands often with soap and water for at least 20 seconds or clean your hands with an alcohol-based hand sanitizer that contains at least 60% alcohol. 8. As much as possible, stay in a specific room and away from other people in your home. Also, you should use a separate bathroom, if available. If you need to be around other people in or outside of the home, wear a mask. 9. Avoid sharing personal items with other people in your household, like dishes, towels, and bedding. 10. Clean all surfaces that are touched often, like counters, tabletops, and doorknobs. Use household cleaning sprays or wipes according to the label instructions. cdc.gov/coronavirus 09/02/2018 This information is not intended to replace advice given to you by your health care provider. Make sure you discuss any questions you have with your health care provider. Document Revised: 02/04/2019 Document Reviewed: 02/04/2019 Elsevier Patient Education  2020 Elsevier Inc.  If you have any questions or concerns after the infusion please call the Advanced Practice Provider on call at [phone]. This number is only intended for your use regarding questions or concerns about the infusion post-treatment  side-effects.  Please do not provide this number to others for use.   If someone you know is interested in receiving treatment please have them call the COVID hotline at [phone].    

## 2020-02-01 ENCOUNTER — Ambulatory Visit (HOSPITAL_COMMUNITY)
Admit: 2020-02-01 | Discharge: 2020-02-01 | Disposition: A | Discharge status: Home / Self Care | Payer: 59 | Attending: Pulmonary Disease | Admitting: Pulmonary Disease

## 2020-02-01 DIAGNOSIS — U071 COVID-19: Secondary | ICD-10-CM | POA: Diagnosis not present

## 2020-02-01 MED ORDER — DIPHENHYDRAMINE HCL 50 MG/ML IJ SOLN: 50.0000 mg | Freq: Once | INTRAMUSCULAR | Status: DC | PRN

## 2020-02-01 MED ORDER — ALBUTEROL SULFATE HFA 108 (90 BASE) MCG/ACT IN AERS: 2.0000 | INHALATION_SPRAY | Freq: Once | RESPIRATORY_TRACT | Status: DC | PRN

## 2020-02-01 MED ORDER — EPINEPHRINE 0.3 MG/0.3ML IJ SOAJ: 0.3000 mg | Freq: Once | INTRAMUSCULAR | Status: DC | PRN

## 2020-02-01 MED ORDER — FAMOTIDINE IN NACL 20-0.9 MG/50ML-% IV SOLN: 20.0000 mg | Freq: Once | INTRAVENOUS | Status: DC | PRN

## 2020-02-01 MED ORDER — SODIUM CHLORIDE 0.9 % IV SOLN
100.0000 mg | Freq: Once | INTRAVENOUS | Status: AC
  Administered 2020-02-01: 100 mg via INTRAVENOUS

## 2020-02-01 MED ORDER — METHYLPREDNISOLONE SODIUM SUCC 125 MG IJ SOLR: 125.0000 mg | Freq: Once | INTRAMUSCULAR | Status: DC | PRN

## 2020-02-01 MED ORDER — SODIUM CHLORIDE 0.9 % IV SOLN: INTRAVENOUS | Status: DC | PRN

## 2020-02-01 NOTE — Progress Notes (Signed)
  Diagnosis: COVID-19  Physician: Dr. Wright  Procedure: [facility] Med: remdesivir infusion - Provided patient with remdesivir fact sheet for patients, parents and caregivers prior to infusion.  Complications: No immediate complications noted.  Discharge: Discharged home   Genny Caulder J Kierah Goatley 02/01/2020  

## 2020-02-02 LAB — CULTURE, BLOOD (ROUTINE X 2)
Culture: NO GROWTH
Culture: NO GROWTH
Special Requests: ADEQUATE
Special Requests: ADEQUATE

## 2020-03-09 ENCOUNTER — Ambulatory Visit (INDEPENDENT_AMBULATORY_CARE_PROVIDER_SITE_OTHER): Payer: 59 | Admitting: Family Medicine

## 2020-03-09 ENCOUNTER — Other Ambulatory Visit: Payer: Self-pay

## 2020-03-09 ENCOUNTER — Ambulatory Visit (INDEPENDENT_AMBULATORY_CARE_PROVIDER_SITE_OTHER): Payer: 59

## 2020-03-09 ENCOUNTER — Encounter: Payer: Self-pay | Admitting: Family Medicine

## 2020-03-09 VITALS — BP 122/88 | HR 112 | Temp 97.7°F | Wt 262.6 lb

## 2020-03-09 DIAGNOSIS — U071 COVID-19: Secondary | ICD-10-CM | POA: Diagnosis not present

## 2020-03-09 DIAGNOSIS — J1282 Pneumonia due to coronavirus disease 2019: Secondary | ICD-10-CM

## 2020-03-09 DIAGNOSIS — Z2821 Immunization not carried out because of patient refusal: Secondary | ICD-10-CM | POA: Diagnosis not present

## 2020-03-09 DIAGNOSIS — R9431 Abnormal electrocardiogram [ECG] [EKG]: Secondary | ICD-10-CM | POA: Diagnosis not present

## 2020-03-09 LAB — COMPREHENSIVE METABOLIC PANEL
ALT: 29 U/L (ref 0–35)
AST: 22 U/L (ref 0–37)
Albumin: 3.9 g/dL (ref 3.5–5.2)
Alkaline Phosphatase: 83 U/L (ref 39–117)
BUN: 14 mg/dL (ref 6–23)
CO2: 21 mEq/L (ref 19–32)
Calcium: 9.2 mg/dL (ref 8.4–10.5)
Chloride: 107 mEq/L (ref 96–112)
Creatinine, Ser: 0.71 mg/dL (ref 0.40–1.20)
GFR: 101.39 mL/min (ref >60.00)
Glucose, Bld: 87 mg/dL (ref 70–99)
Potassium: 3.8 mEq/L (ref 3.5–5.1)
Sodium: 138 mEq/L (ref 135–145)
Total Bilirubin: 0.5 mg/dL (ref 0.2–1.2)
Total Protein: 7.1 g/dL (ref 6.0–8.3)

## 2020-03-09 NOTE — Progress Notes (Signed)
Subjective  CC:  Chief Complaint  Patient presents with  . Hospitalization Follow-up    COVID positive pneumonia, states that she is still trying to gain back strength, throat feels "raw"    HPI: Adrienne Summers is a 50 y.o. female who presents to the office today to address the problems listed above in the chief complaint.  50 year old female here for hospital follow-up: Admitted November 26 and hospitalized for 2 days for COVID-pneumonia, sepsis, hypokalemia, and abnormal EKG.  I reviewed all data from that hospitalization.  Briefly, she need required 1 L nasal cannula oxygen for hypoxia, antivirals and steroids, potassium and fluids.  Work-up included EKG which showed anterior Q waves, echocardiogram which was normal, lower extremity Dopplers did not show DVT, and lab work.  Since discharge, she is feeling better.  Getting energy back.  No longer shortness of breath.  At times will have pain with deep breathing but no labored breathing.  She is eating and drinking well.  No further fevers or body aches.  He had anterior Q waves on EKG with normal echocardiogram: Has never had any chest pain.  Risk factors for cardiovascular disease include obesity and borderline hyperlipidemia.  COVID-vaccine counseling: Patient is unvaccinated and experienced infection requiring hospitalization.  Her mother is also unvaccinated and required 9-day hospitalization.  Fortunately, both are recovering.  She continues to decline vaccination at this time.  She is fearful of the vaccine and she has had adverse reactions from the flu vaccine in the past.  Assessment  1. Pneumonia due to COVID-19 virus   2. Abnormal EKG   3. COVID-19 vaccination declined   4. Morbid obesity [facility]) Chronic     Plan   Hospital follow-up for COVID-pneumonia and sepsis: Fortunately, she is improving.  We will recheck chest x-ray today to ensure improvement.  Clinically she is improving.  Recheck lab work.  She was on  steroids with recheck CBC.  She is back to work part-time.  Tolerating this well.  Should continue to improve.  No prolonged complications today.  Abnormal EKG: Possible old anterior infarct changes could be related to pulmonary disease due to COVID-pneumonia.  Recheck once resolved in the next 6 to 12 months.  No further cardiac evaluation needed at this time.  Patient will let me know if she has any chest pain or worrisome symptoms.  COVID vaccination Plan.  Patient Declines.  Follow up: As scheduled for complete physical 05/01/2020  Orders Placed This Encounter  Procedures  . DG Chest 2 View  . Comprehensive metabolic panel  . EKG 12-Lead   No orders of the defined types were placed in this encounter.     I reviewed the patients updated PMH, FH, and SocHx.    Patient Active Problem List   Diagnosis Date Noted  . COVID-19 vaccination declined 03/09/2020  . Abnormal EKG 01/29/2020  . Rosacea 04/28/2019  . Fibrocystic breast changes, bilateral 04/28/2019  . Fibromyalgia 04/13/2018  . Interstitial cystitis 04/13/2018  . GERD (gastroesophageal reflux disease) 04/13/2018  . Subclinical hypothyroidism 04/13/2018  . Panic disorder 04/13/2018  . IBS (irritable bowel syndrome) 04/13/2018  . Morbid obesity (HCC) 04/13/2018   Current Meds  Medication Sig  . albuterol (VENTOLIN HFA) 108 (90 Base) MCG/ACT inhaler Inhale 2 puffs into the lungs every 4 (four) hours as needed for wheezing or shortness of breath.  . ALPRAZolam (XANAX) 0.25 MG tablet Take 0.25 mg by mouth 3 (three) times daily as needed for anxiety.   Marland Kitchen  cetirizine (ZYRTEC) 10 MG tablet Take 10 mg by mouth daily.  . sertraline (ZOLOFT) 50 MG tablet TAKE 1 TABLET BY MOUTH EVERY DAY (Patient taking differently: Take 50 mg by mouth at bedtime.)    Allergies: Patient is allergic to contrast media [iodinated diagnostic agents], codeine, tape, penicillins, sulfa antibiotics, and sulfamethoxazole. Family History: Patient family  history includes Cancer in her brother, maternal grandmother, mother, and paternal grandmother; Healthy in her daughter; Hearing loss in her father and mother; Heart attack in her maternal grandfather and maternal grandmother; High Cholesterol in her father, mother, paternal grandfather, and paternal grandmother; High blood pressure in her maternal grandmother and mother. Social History:  Patient  reports that she has never smoked. She has never used smokeless tobacco. She reports current alcohol use. She reports that she does not use drugs.  Review of Systems: Constitutional: Negative for fever malaise or anorexia Cardiovascular: negative for chest pain Respiratory: negative for SOB or persistent cough Gastrointestinal: negative for abdominal pain  Objective  Vitals: BP 122/88   Pulse (!) 112   Temp 97.7 F (36.5 C) (Temporal)   Wt 262 lb 9.6 oz (119.1 kg)   SpO2 97%   BMI 46.52 kg/m  General: no acute distress , A&Ox3 HEENT: PEERL, conjunctiva normal, neck is supple Cardiovascular:  RRR without murmur or gallop.  No lower extremity edema Respiratory:  Good breath sounds bilaterally, CTAB with normal respiratory effort Skin:  Warm, no rashes  EKG: Compared to November 26 and 28: Normal sinus rhythm, heart rate 94, Q waves anterior leads poor R wave progression unchanged from comparisons   Commons side effects, risks, benefits, and alternatives for medications and treatment plan prescribed today were discussed, and the patient expressed understanding of the given instructions. Patient is instructed to call or message via MyChart if he/she has any questions or concerns regarding our treatment plan. No barriers to understanding were identified. We discussed Red Flag symptoms and signs in detail. Patient expressed understanding regarding what to do in case of urgent or emergency type symptoms.   Medication list was reconciled, printed and provided to the patient in AVS. Patient  instructions and summary information was reviewed with the patient as documented in the AVS. This note was prepared with assistance of Dragon voice recognition software. Occasional wrong-word or sound-a-like substitutions may have occurred due to the inherent limitations of voice recognition software  This visit occurred during the SARS-CoV-2 public health emergency.  Safety protocols were in place, including screening questions prior to the visit, additional usage of staff PPE, and extensive cleaning of exam room while observing appropriate contact time as indicated for disinfecting solutions.

## 2020-03-09 NOTE — Patient Instructions (Signed)
Please follow up as scheduled for your next visit with me: 05/01/2020   I will release your lab results to you on your MyChart account with further instructions. Please reply with any questions.   If you have any questions or concerns, please don't hesitate to send me a message via MyChart or call the office at [phone]. Thank you for visiting with [place] today! It's our pleasure caring for you.  Please consider getting vaccinated.  Glad you are improving.

## 2020-05-01 ENCOUNTER — Encounter: Payer: Self-pay | Admitting: Family Medicine

## 2020-05-01 ENCOUNTER — Other Ambulatory Visit: Payer: Self-pay

## 2020-05-01 ENCOUNTER — Ambulatory Visit (INDEPENDENT_AMBULATORY_CARE_PROVIDER_SITE_OTHER): Payer: 59 | Admitting: Family Medicine

## 2020-05-01 VITALS — BP 122/78 | HR 99 | Temp 98.1°F | Resp 16 | Ht 63.0 in | Wt 261.8 lb

## 2020-05-01 DIAGNOSIS — N6012 Diffuse cystic mastopathy of left breast: Secondary | ICD-10-CM

## 2020-05-01 DIAGNOSIS — Z0000 Encounter for general adult medical examination without abnormal findings: Secondary | ICD-10-CM

## 2020-05-01 DIAGNOSIS — N6011 Diffuse cystic mastopathy of right breast: Secondary | ICD-10-CM

## 2020-05-01 DIAGNOSIS — M797 Fibromyalgia: Secondary | ICD-10-CM | POA: Diagnosis not present

## 2020-05-01 DIAGNOSIS — Z8616 Personal history of COVID-19: Secondary | ICD-10-CM

## 2020-05-01 DIAGNOSIS — F410 Panic disorder [episodic paroxysmal anxiety] without agoraphobia: Secondary | ICD-10-CM

## 2020-05-01 DIAGNOSIS — E038 Other specified hypothyroidism: Secondary | ICD-10-CM | POA: Diagnosis not present

## 2020-05-01 DIAGNOSIS — N3010 Interstitial cystitis (chronic) without hematuria: Secondary | ICD-10-CM

## 2020-05-01 LAB — CBC WITH DIFFERENTIAL/PLATELET
Basophils Absolute: 0.3 10*3/uL — ABNORMAL HIGH (ref 0.0–0.1)
Basophils Relative: 3.7 % — ABNORMAL HIGH (ref 0.0–3.0)
Eosinophils Absolute: 0.2 10*3/uL (ref 0.0–0.7)
Eosinophils Relative: 3 % (ref 0.0–5.0)
HCT: 40.8 % (ref 36.0–46.0)
Hemoglobin: 13.7 g/dL (ref 12.0–15.0)
Lymphocytes Relative: 12.5 % (ref 12.0–46.0)
Lymphs Abs: 0.8 10*3/uL (ref 0.7–4.0)
MCHC: 33.5 g/dL (ref 30.0–36.0)
MCV: 88.1 fl (ref 78.0–100.0)
Monocytes Absolute: 0.3 10*3/uL (ref 0.1–1.0)
Monocytes Relative: 4.3 % (ref 3.0–12.0)
Neutro Abs: 5.2 10*3/uL (ref 1.4–7.7)
Neutrophils Relative %: 76.5 % (ref 43.0–77.0)
Platelets: 371 10*3/uL (ref 150.0–400.0)
RBC: 4.63 Mil/uL (ref 3.87–5.11)
RDW: 14 % (ref 11.5–15.5)
WBC: 6.8 10*3/uL (ref 4.0–10.5)

## 2020-05-01 LAB — LIPID PANEL
Cholesterol: 215 mg/dL — ABNORMAL HIGH (ref 0–200)
HDL: 57 mg/dL (ref >39.00)
LDL Cholesterol: 143 mg/dL — ABNORMAL HIGH (ref 0–99)
NonHDL: 158.26
Total CHOL/HDL Ratio: 4
Triglycerides: 78 mg/dL (ref 0.0–149.0)
VLDL: 15.6 mg/dL (ref 0.0–40.0)

## 2020-05-01 LAB — COMPREHENSIVE METABOLIC PANEL
ALT: 20 U/L (ref 0–35)
AST: 18 U/L (ref 0–37)
Albumin: 4 g/dL (ref 3.5–5.2)
Alkaline Phosphatase: 85 U/L (ref 39–117)
BUN: 11 mg/dL (ref 6–23)
CO2: 27 mEq/L (ref 19–32)
Calcium: 9.4 mg/dL (ref 8.4–10.5)
Chloride: 104 mEq/L (ref 96–112)
Creatinine, Ser: 0.74 mg/dL (ref 0.40–1.20)
GFR: 96.38 mL/min (ref >60.00)
Glucose, Bld: 88 mg/dL (ref 70–99)
Potassium: 3.9 mEq/L (ref 3.5–5.1)
Sodium: 139 mEq/L (ref 135–145)
Total Bilirubin: 0.4 mg/dL (ref 0.2–1.2)
Total Protein: 7.3 g/dL (ref 6.0–8.3)

## 2020-05-01 LAB — T4, FREE: Free T4: 0.68 ng/dL (ref 0.60–1.60)

## 2020-05-01 LAB — TSH: TSH: 5.44 u[IU]/mL — ABNORMAL HIGH (ref 0.35–4.50)

## 2020-05-01 NOTE — Progress Notes (Signed)
Subjective  Chief Complaint  Patient presents with  . Annual Exam    fasting    HPI: Adrienne Summers is a 50 y.o. female who presents to [facility] [place] today for a Female Wellness Visit. She also has the concerns and/or needs as listed above in the chief complaint. These will be addressed in addition to the Health Maintenance Visit.   Wellness Visit: annual visit with health maintenance review and exam without Pap  Health maintenance: Patient due to see her gynecologist next month.  Mammogram is due.  Pap smear is up-to-date.  She continues to decline flu shot and Covid vaccinations. Chronic disease f/u and/or acute problem visit: (deemed necessary to be done in addition to the wellness visit):  History of Covid pneumonia back in November: She continues to recover.  She just went back to work last week.  She mainly was out of work due to fatigue.  She denies any problems with breathing or chest pain.  She feels almost back to herself.  Normal appetite.  Declines vaccination.  Fibromyalgia, fibrocystic breast changes history of GERD and IBS: Currently without symptoms for any of these mild problems.  Not taking medications.  Denies systemic pain.  Subclinical hypothyroidism with history of hypothyroidism in the past.  Admits to feeling bloated often but denies swelling.  Has had fatigue due to Covid but now energy has returned to normal.  No skin changes or hair changes.  Normal mentation.  Interstitial cystitis that is not currently active.  Panic disorder on chronic Zoloft 50 mg daily.  Feels that her mood is well controlled.  No panic symptoms or worsen anxiety given her recent illness.  Rare Xanax use.  Assessment  1. Annual physical exam   2. Fibromyalgia   3. Fibrocystic breast changes, bilateral   4. Subclinical hypothyroidism   5. Morbid obesity (HCC)   6. Interstitial cystitis   7. Panic disorder   8. History of COVID-19      Plan  Female  Wellness Visit:  Age appropriate Health Maintenance and Prevention measures were discussed with patient. Included topics are cancer screening recommendations, ways to keep healthy (see AVS) including dietary and exercise recommendations, regular eye and dental care, use of seat belts, and avoidance of moderate alcohol use and tobacco use.  Mammogram due.  Other screens are up-to-date.  We discussed colon cancer screening will defer at this time due to Covid and recent hospitalization.  Will reassess next year.  She is average risk.  BMI: discussed patient's BMI and encouraged positive lifestyle modifications to help get to or maintain a target BMI.  HM needs and immunizations were addressed and ordered. See below for orders. See HM and immunization section for updates.  Vaccine counseling done.  Routine labs and screening tests ordered including cmp, cbc and lipids where appropriate.  Discussed recommendations regarding Vit D and calcium supplementation (see AVS)  Chronic disease management visit and/or acute problem visit:  Fibromyalgia, GERD, IBS, interstitial cystitis: Fortunately all of these problems are not currently active.  Monitor for symptoms.  Subclinical hypothyroidism: Recheck today.  Morbid obesity: Continue to recommend diet and exercise.  Panic disorder is well controlled on Zoloft 50 mg daily.  No changes made.  History of COVID-19 pneumonia: Recovered without residual effects.   Follow up: Return in about 1 year (around 05/01/2021) for complete physical.  Orders Placed This Encounter  Procedures  . CBC with Differential/Platelet  . Comprehensive metabolic panel  .  Hepatitis C antibody  . Lipid panel  . T3  . T4, free  . TSH   No orders of the defined types were placed in this encounter.     Body mass index is 46.38 kg/m. Wt Readings from Last 3 Encounters:  05/01/20 261 lb 12.8 oz (118.8 kg)  03/09/20 262 lb 9.6 oz (119.1 kg)  01/29/20 251 lb 5.2 oz (114  kg)     Patient Active Problem List   Diagnosis Date Noted  . COVID-19 vaccination declined 03/09/2020  . Abnormal EKG 01/29/2020  . Rosacea 04/28/2019  . Fibrocystic breast changes, bilateral 04/28/2019  . Fibromyalgia 04/13/2018    Dxd in 2001; evaluated by rheumatologist, Dr. Phylliss Bob; also has seen Dr. Corliss Skains but was not a good fit. Failed Cymbalta due to side effects   . Interstitial cystitis 04/13/2018    Managed by Dr. Logan Bores   . GERD (gastroesophageal reflux disease) 04/13/2018  . Subclinical hypothyroidism 04/13/2018  . Panic disorder 04/13/2018    Started in 1998; had been on low dose zoloft, and prn xanax   . IBS (irritable bowel syndrome) 04/13/2018  . Morbid obesity (HCC) 04/13/2018   Health Maintenance  Topic Date Due  . Hepatitis C Screening  Never done  . INFLUENZA VACCINE  06/01/2020 (Originally 10/03/2019)  . COLONOSCOPY (Pts 45-29yrs Insurance coverage will need to be confirmed)  05/01/2021 (Originally 12/15/2017)  . COVID-19 Vaccine (1) 05/02/2021 (Originally [birth year]/15/1984)  . MAMMOGRAM  03/31/2021  . PAP SMEAR-Modifier  03/31/2022  . TETANUS/TDAP  04/27/2029  . HIV Screening  Completed   Immunization History  Administered Date(s) Administered  . Tdap 04/28/2019   We updated and reviewed the patient's past history in detail and it is documented below. Allergies: Patient is allergic to contrast media [iodinated diagnostic agents], codeine, tape, penicillins, sulfa antibiotics, and sulfamethoxazole. Past Medical History Patient  has a past medical history of Anxiety, Endometriosis, Fibromyalgia, GERD (gastroesophageal reflux disease) (04/13/2018), IBS (irritable bowel syndrome), Interstitial cystitis (04/13/2018), Morbid obesity (HCC) (04/13/2018), Panic disorder (04/13/2018), Pneumonia due to COVID-19 virus (01/28/2020), and Thyroid disease. Past Surgical History Patient  has a past surgical history that includes Endometrial ablation. Family History: Patient  family history includes Cancer in her brother, maternal grandmother, mother, and paternal grandmother; Healthy in her daughter; Hearing loss in her father and mother; Heart attack in her maternal grandfather and maternal grandmother; High Cholesterol in her father, mother, paternal grandfather, and paternal grandmother; High blood pressure in her maternal grandmother and mother. Social History:  Patient  reports that she has never smoked. She has never used smokeless tobacco. She reports current alcohol use. She reports that she does not use drugs.  Review of Systems: Constitutional: negative for fever or malaise Ophthalmic: negative for photophobia, double vision or loss of vision Cardiovascular: negative for chest pain, dyspnea on exertion, or new LE swelling Respiratory: negative for SOB or persistent cough Gastrointestinal: negative for abdominal pain, change in bowel habits or melena Genitourinary: negative for dysuria or gross hematuria, no abnormal uterine bleeding or disharge Musculoskeletal: negative for new gait disturbance or muscular weakness Integumentary: negative for new or persistent rashes, no breast lumps Neurological: negative for TIA or stroke symptoms Psychiatric: negative for SI or delusions Allergic/Immunologic: negative for hives  Patient Care Team    Relationship Specialty Notifications Start End  Willow Ora, MD PCP - General Family Medicine  04/13/18   Charna Elizabeth, MD Consulting Physician Gastroenterology  04/13/18   Freddy Finner, MD Consulting Physician Obstetrics and  Gynecology  04/28/19     Objective  Vitals: BP 122/78   Pulse 99   Temp 98.1 F (36.7 C) (Temporal)   Resp 16   Ht 5\' 3"  (1.6 m)   Wt 261 lb 12.8 oz (118.8 kg)   BMI 46.38 kg/m  General:  Well developed, well nourished, no acute distress  Psych:  Alert and orientedx3,normal mood and affect HEENT:  Normocephalic, atraumatic, non-icteric sclera,  supple neck without adenopathy, mass or  thyromegaly Cardiovascular:  Normal S1, S2, RRR without gallop, rub or murmur Respiratory:  Good breath sounds bilaterally, CTAB with normal respiratory effort Gastrointestinal: normal bowel sounds, soft, non-tender, no noted masses. No HSM MSK: no deformities, contusions. Joints are without erythema or swelling.  Skin:  Warm, no rashes or suspicious lesions noted Neurologic:    Mental status is normal. CN 2-11 are normal. Gross motor and sensory exams are normal. Normal gait. No tremor    Commons side effects, risks, benefits, and alternatives for medications and treatment plan prescribed today were discussed, and the patient expressed understanding of the given instructions. Patient is instructed to call or message via MyChart if he/she has any questions or concerns regarding our treatment plan. No barriers to understanding were identified. We discussed Red Flag symptoms and signs in detail. Patient expressed understanding regarding what to do in case of urgent or emergency type symptoms.   Medication list was reconciled, printed and provided to the patient in AVS. Patient instructions and summary information was reviewed with the patient as documented in the AVS. This note was prepared with assistance of Dragon voice recognition software. Occasional wrong-word or sound-a-like substitutions may have occurred due to the inherent limitations of voice recognition software  This visit occurred during the SARS-CoV-2 public health emergency.  Safety protocols were in place, including screening questions prior to the visit, additional usage of staff PPE, and extensive cleaning of exam room while observing appropriate contact time as indicated for disinfecting solutions.

## 2020-05-01 NOTE — Patient Instructions (Signed)
Please return in 12 months for your annual complete physical; please come fasting.  I will release your lab results to you on your MyChart account with further instructions. Please reply with any questions.   If you have any questions or concerns, please don't hesitate to send me a message via MyChart or call the office at [phone]. Thank you for visiting with [place] today! It's our pleasure caring for you.   Preventive Care 57-48 Years Old, Female Preventive care refers to lifestyle choices and visits with your health care provider that can promote health and wellness. This includes:  A yearly physical exam. This is also called an annual wellness visit.  Regular dental and eye exams.  Immunizations.  Screening for certain conditions.  Healthy lifestyle choices, such as: ? Eating a healthy diet. ? Getting regular exercise. ? Not using drugs or products that contain nicotine and tobacco. ? Limiting alcohol use. What can I expect for my preventive care visit? Physical exam Your health care provider will check your:  Height and weight. These may be used to calculate your BMI (body mass index). BMI is a measurement that tells if you are at a healthy weight.  Heart rate and blood pressure.  Body temperature.  Skin for abnormal spots. Counseling Your health care provider may ask you questions about your:  Past medical problems.  Family's medical history.  Alcohol, tobacco, and drug use.  Emotional well-being.  Home life and relationship well-being.  Sexual activity.  Diet, exercise, and sleep habits.  Work and work Statistician.  Access to firearms.  Method of birth control.  Menstrual cycle.  Pregnancy history. What immunizations do I need? Vaccines are usually given at various ages, according to a schedule. Your health care provider will recommend vaccines for you based on your age, medical history, and lifestyle or other factors, such as travel or where you  work.   What tests do I need? Blood tests  Lipid and cholesterol levels. These may be checked every 5 years, or more often if you are over 24 years old.  Hepatitis C test.  Hepatitis B test. Screening  Lung cancer screening. You may have this screening every year starting at age 10 if you have a 30-pack-year history of smoking and currently smoke or have quit within the past 15 years.  Colorectal cancer screening. ? All adults should have this screening starting at age 28 and continuing until age 71. ? Your health care provider may recommend screening at age 73 if you are at increased risk. ? You will have tests every 1-10 years, depending on your results and the type of screening test.  Diabetes screening. ? This is done by checking your blood sugar (glucose) after you have not eaten for a while (fasting). ? You may have this done every 1-3 years.  Mammogram. ? This may be done every 1-2 years. ? Talk with your health care provider about when you should start having regular mammograms. This may depend on whether you have a family history of breast cancer.  BRCA-related cancer screening. This may be done if you have a family history of breast, ovarian, tubal, or peritoneal cancers.  Pelvic exam and Pap test. ? This may be done every 3 years starting at age 36. ? Starting at age 38, this may be done every 5 years if you have a Pap test in combination with an HPV test. Other tests  STD (sexually transmitted disease) testing, if you are at risk.  Bone  density scan. This is done to screen for osteoporosis. You may have this scan if you are at high risk for osteoporosis. Talk with your health care provider about your test results, treatment options, and if necessary, the need for more tests. Follow these instructions at home: Eating and drinking  Eat a diet that includes fresh fruits and vegetables, whole grains, lean protein, and low-fat dairy products.  Take vitamin and mineral  supplements as recommended by your health care provider.  Do not drink alcohol if: ? Your health care provider tells you not to drink. ? You are pregnant, may be pregnant, or are planning to become pregnant.  If you drink alcohol: ? Limit how much you have to 0-1 drink a day. ? Be aware of how much alcohol is in your drink. In the [place] one drink equals one 12 oz bottle of beer (355 mL), one 5 oz glass of wine (148 mL), or one 1 oz glass of hard liquor (44 mL).   Lifestyle  Take daily care of your teeth and gums. Brush your teeth every morning and night with fluoride toothpaste. Floss one time each day.  Stay active. Exercise for at least 30 minutes 5 or more days each week.  Do not use any products that contain nicotine or tobacco, such as cigarettes, e-cigarettes, and chewing tobacco. If you need help quitting, ask your health care provider.  Do not use drugs.  If you are sexually active, practice safe sex. Use a condom or other form of protection to prevent STIs (sexually transmitted infections).  If you do not wish to become pregnant, use a form of birth control. If you plan to become pregnant, see your health care provider for a prepregnancy visit.  If told by your health care provider, take low-dose aspirin daily starting at age 97.  Find healthy ways to cope with stress, such as: ? Meditation, yoga, or listening to music. ? Journaling. ? Talking to a trusted person. ? Spending time with friends and family. Safety  Always wear your seat belt while driving or riding in a vehicle.  Do not drive: ? If you have been drinking alcohol. Do not ride with someone who has been drinking. ? When you are tired or distracted. ? While texting.  Wear a helmet and other protective equipment during sports activities.  If you have firearms in your house, make sure you follow all gun safety procedures. What's next?  Visit your health care provider once a year for an annual wellness  visit.  Ask your health care provider how often you should have your eyes and teeth checked.  Stay up to date on all vaccines. This information is not intended to replace advice given to you by your health care provider. Make sure you discuss any questions you have with your health care provider. Document Revised: 11/23/2019 Document Reviewed: 10/30/2017 Elsevier Patient Education  2021 [employer].

## 2020-05-02 LAB — T3: T3, Total: 95 ng/dL (ref 76–181)

## 2020-05-02 LAB — HEPATITIS C ANTIBODY
Hepatitis C Ab: NONREACTIVE
SIGNAL TO CUT-OFF: 0.02 (ref <1.00)

## 2020-05-04 ENCOUNTER — Encounter: Payer: Self-pay | Admitting: Family Medicine

## 2020-09-06 ENCOUNTER — Other Ambulatory Visit: Payer: Self-pay | Admitting: Family Medicine

## 2020-09-08 ENCOUNTER — Other Ambulatory Visit: Payer: Self-pay | Admitting: Obstetrics & Gynecology

## 2020-09-08 DIAGNOSIS — Z1231 Encounter for screening mammogram for malignant neoplasm of breast: Secondary | ICD-10-CM

## 2020-11-07 ENCOUNTER — Ambulatory Visit: Payer: 59

## 2020-12-06 ENCOUNTER — Ambulatory Visit
Admission: RE | Admit: 2020-12-06 | Discharge: 2020-12-06 | Disposition: A | Discharge status: Home / Self Care | Payer: 59 | Source: Ambulatory Visit | Attending: Obstetrics & Gynecology | Admitting: Obstetrics & Gynecology

## 2020-12-06 ENCOUNTER — Other Ambulatory Visit: Payer: Self-pay

## 2020-12-06 DIAGNOSIS — Z1231 Encounter for screening mammogram for malignant neoplasm of breast: Secondary | ICD-10-CM

## 2021-02-09 ENCOUNTER — Telehealth: Payer: Self-pay

## 2021-02-09 ENCOUNTER — Other Ambulatory Visit: Payer: Self-pay

## 2021-02-09 MED ORDER — SERTRALINE HCL 50 MG PO TABS: 50.0000 mg | ORAL_TABLET | Freq: Every day | ORAL | 3 refills | Status: DC

## 2021-02-09 NOTE — Telephone Encounter (Signed)
  Encourage patient to contact the pharmacy for refills or they can request refills through [facility]  LAST APPOINTMENT DATE: 03/09/20  NEXT APPOINTMENT DATE:  MEDICATION: sertraline (ZOLOFT) 50 MG tablet  Is the patient out of medication? yes  PHARMACY: [facility] -fax:[phone]  Let patient know to contact pharmacy at the end of the day to make sure medication is ready.  Please notify patient to allow 48-72 hours to process

## 2021-05-02 ENCOUNTER — Encounter: Payer: 59 | Admitting: Family Medicine

## 2021-09-18 ENCOUNTER — Encounter: Payer: Self-pay | Admitting: Family Medicine

## 2021-10-17 ENCOUNTER — Encounter: Payer: Self-pay | Admitting: Physician Assistant

## 2021-10-17 ENCOUNTER — Ambulatory Visit (INDEPENDENT_AMBULATORY_CARE_PROVIDER_SITE_OTHER): Payer: BC Managed Care – PPO | Admitting: Physician Assistant

## 2021-10-17 VITALS — BP 120/88 | HR 108 | Temp 98.1°F | Ht 63.0 in | Wt 217.5 lb

## 2021-10-17 DIAGNOSIS — L0291 Cutaneous abscess, unspecified: Secondary | ICD-10-CM

## 2021-10-17 NOTE — Progress Notes (Signed)
Adrienne Summers is a 51 y.o. female here for a follow up of a pre-existing problem.  History of Present Illness:   Chief Complaint  Patient presents with   Cyst    Pt has a cyst on top of chest area, was just a white pimple now since Saturday area is red, swollen and painful.    HPI  Abscess Has a cyst on her chest. Has been there for months and was just a bump but now is getting large and painful. Tried to express it over the weekend but was incredibly painful and she was unsuccessful with this.  She denies fever, chills, malaise.  She has taken ibuprofen for pain.  Past Medical History:  Diagnosis Date   Anxiety    Endometriosis    Fibromyalgia    GERD (gastroesophageal reflux disease) 04/13/2018   IBS (irritable bowel syndrome)    Interstitial cystitis 04/13/2018   Morbid obesity (HCC) 04/13/2018   Panic disorder 04/13/2018   Pneumonia due to COVID-19 virus 01/28/2020   Thyroid disease      Social History   Tobacco Use   Smoking status: Never   Smokeless tobacco: Never  Vaping Use   Vaping Use: Never used  Substance Use Topics   Alcohol use: Yes   Drug use: Never    Past Surgical History:  Procedure Laterality Date   ENDOMETRIAL ABLATION     Dr. Jennette Kettle    Family History  Problem Relation Age of Onset   Healthy Daughter    Cancer Mother    Hearing loss Mother    High Cholesterol Mother    High blood pressure Mother    Hearing loss Father    High Cholesterol Father    Cancer Brother    Cancer Maternal Grandmother    Heart attack Maternal Grandmother    High blood pressure Maternal Grandmother    Heart attack Maternal Grandfather    Cancer Paternal Grandmother    High Cholesterol Paternal Grandmother    High Cholesterol Paternal Grandfather     Allergies  Allergen Reactions   Contrast Media [Iodinated Contrast Media] Hives and Itching   Codeine Nausea And Vomiting and Diarrhea   Tape Itching and Other (See Comments)    Adhesive Tape - Pulls skin  off; makes skin red   Penicillins Rash   Sulfa Antibiotics Rash   Sulfamethoxazole Rash    Current Medications:   Current Outpatient Medications:    ALPRAZolam (XANAX) 0.25 MG tablet, Take 0.25 mg by mouth 3 (three) times daily as needed for anxiety. , Disp: , Rfl:    cetirizine (ZYRTEC) 10 MG tablet, Take 10 mg by mouth daily., Disp: , Rfl:    liothyronine (CYTOMEL) 5 MCG tablet, Take 5 mcg by mouth daily., Disp: , Rfl:    Semaglutide,0.25 or 0.5MG /DOS, (OZEMPIC, 0.25 OR 0.5 MG/DOSE,) 2 MG/3ML SOPN, Inject 0.25 mg into the skin once a week., Disp: , Rfl:    sertraline (ZOLOFT) 50 MG tablet, Take 1 tablet (50 mg total) by mouth daily., Disp: 90 tablet, Rfl: 3   TESTOSTERONE CYPIONATE IJ, Inject as directed once a week., Disp: , Rfl:    Review of Systems:   ROS Negative unless otherwise specified per HPI.   Vitals:   Vitals:   10/17/21 1517  BP: 120/88  Pulse: (!) 108  Temp: 98.1 F (36.7 C)  TempSrc: Temporal  SpO2: 94%  Weight: 217 lb 8 oz (98.7 kg)  Height: 5\' 3"  (1.6 m)  Body mass index is 38.53 kg/m.  Physical Exam:   Physical Exam Constitutional:      Appearance: Normal appearance. She is well-developed.  HENT:     Head: Normocephalic and atraumatic.  Eyes:     General: Lids are normal.     Extraocular Movements: Extraocular movements intact.     Conjunctiva/sclera: Conjunctivae normal.  Pulmonary:     Effort: Pulmonary effort is normal.  Musculoskeletal:        General: Normal range of motion.     Cervical back: Normal range of motion and neck supple.  Skin:    General: Skin is warm and dry.     Comments: Approximately 5 cm raised erythematous abscess to left upper chest with induration  Neurological:     Mental Status: She is alert and oriented to person, place, and time.  Psychiatric:        Attention and Perception: Attention and perception normal.        Mood and Affect: Mood normal.        Behavior: Behavior normal.        Thought Content:  Thought content normal.        Judgment: Judgment normal.    Procedure: I&D  Consent: Risks and benefits of therapy discussed with patient who voices understanding and agrees with planned care. No barriers to communication or understanding identified.  After obtaining informed consent, the patient's identity, procedure, and site were verified during a pause prior to proceeding with the minor surgical procedure as per universal protocol recommendations. Pt aware of risks not limited to but including infection, bleeding, damage to near by organs.  Meds, vitals, and allergies reviewed.  Indication: suspected abscess Pt complaints of: erythema, pain, swelling Location: left chest wall Size: 5 cm  Prep: etoh/betadine Anesthesia: 1%lidocaine with epi Incision made with #11 blade Wound explored and loculations removed  Tolerated well without significant blood loss or obvious complications  Sterilized bandage applied to affected area     Assessment and Plan:   Abscess Aftercare, including wound care, risks of bleeding and infection were discussed. All questions answered.  Return for wound check as needed for further evaluation and management.     Jarold Motto, PA-C

## 2021-10-26 ENCOUNTER — Other Ambulatory Visit: Payer: Self-pay | Admitting: Family Medicine

## 2021-11-23 ENCOUNTER — Encounter: Payer: Self-pay | Admitting: Family Medicine

## 2021-11-26 ENCOUNTER — Encounter: Payer: Self-pay | Admitting: *Deleted

## 2022-01-16 ENCOUNTER — Encounter: Payer: Self-pay | Admitting: Family Medicine

## 2022-01-16 ENCOUNTER — Ambulatory Visit (INDEPENDENT_AMBULATORY_CARE_PROVIDER_SITE_OTHER): Payer: BC Managed Care – PPO | Admitting: Family Medicine

## 2022-01-16 ENCOUNTER — Other Ambulatory Visit: Payer: Self-pay | Admitting: Obstetrics and Gynecology

## 2022-01-16 VITALS — BP 122/80 | HR 74 | Temp 98.0°F | Ht 63.0 in | Wt 207.4 lb

## 2022-01-16 DIAGNOSIS — Z1239 Encounter for other screening for malignant neoplasm of breast: Secondary | ICD-10-CM

## 2022-01-16 DIAGNOSIS — K219 Gastro-esophageal reflux disease without esophagitis: Secondary | ICD-10-CM

## 2022-01-16 DIAGNOSIS — E6609 Other obesity due to excess calories: Secondary | ICD-10-CM

## 2022-01-16 DIAGNOSIS — N3010 Interstitial cystitis (chronic) without hematuria: Secondary | ICD-10-CM | POA: Diagnosis not present

## 2022-01-16 DIAGNOSIS — K589 Irritable bowel syndrome without diarrhea: Secondary | ICD-10-CM

## 2022-01-16 DIAGNOSIS — E038 Other specified hypothyroidism: Secondary | ICD-10-CM | POA: Diagnosis not present

## 2022-01-16 DIAGNOSIS — F410 Panic disorder [episodic paroxysmal anxiety] without agoraphobia: Secondary | ICD-10-CM

## 2022-01-16 DIAGNOSIS — Z1211 Encounter for screening for malignant neoplasm of colon: Secondary | ICD-10-CM

## 2022-01-16 DIAGNOSIS — M797 Fibromyalgia: Secondary | ICD-10-CM

## 2022-01-16 DIAGNOSIS — Z0000 Encounter for general adult medical examination without abnormal findings: Secondary | ICD-10-CM | POA: Diagnosis not present

## 2022-01-16 DIAGNOSIS — Z1212 Encounter for screening for malignant neoplasm of rectum: Secondary | ICD-10-CM

## 2022-01-16 LAB — CBC WITH DIFFERENTIAL/PLATELET
Basophils Absolute: 0 10*3/uL (ref 0.0–0.1)
Basophils Relative: 0.5 % (ref 0.0–3.0)
Eosinophils Absolute: 0.1 10*3/uL (ref 0.0–0.7)
Eosinophils Relative: 1.5 % (ref 0.0–5.0)
HCT: 44.9 % (ref 36.0–46.0)
Hemoglobin: 15.2 g/dL — ABNORMAL HIGH (ref 12.0–15.0)
Lymphocytes Relative: 14.9 % (ref 12.0–46.0)
Lymphs Abs: 1.4 10*3/uL (ref 0.7–4.0)
MCHC: 33.9 g/dL (ref 30.0–36.0)
MCV: 90.4 fl (ref 78.0–100.0)
Monocytes Absolute: 0.4 10*3/uL (ref 0.1–1.0)
Monocytes Relative: 4.4 % (ref 3.0–12.0)
Neutro Abs: 7.6 10*3/uL (ref 1.4–7.7)
Neutrophils Relative %: 78.7 % — ABNORMAL HIGH (ref 43.0–77.0)
Platelets: 354 10*3/uL (ref 150.0–400.0)
RBC: 4.97 Mil/uL (ref 3.87–5.11)
RDW: 13.7 % (ref 11.5–15.5)
WBC: 9.7 10*3/uL (ref 4.0–10.5)

## 2022-01-16 LAB — COMPREHENSIVE METABOLIC PANEL
ALT: 17 U/L (ref 0–35)
AST: 18 U/L (ref 0–37)
Albumin: 4.3 g/dL (ref 3.5–5.2)
Alkaline Phosphatase: 107 U/L (ref 39–117)
BUN: 11 mg/dL (ref 6–23)
CO2: 29 mEq/L (ref 19–32)
Calcium: 9.7 mg/dL (ref 8.4–10.5)
Chloride: 102 mEq/L (ref 96–112)
Creatinine, Ser: 0.67 mg/dL (ref 0.40–1.20)
GFR: 102.87 mL/min (ref >60.00)
Glucose, Bld: 82 mg/dL (ref 70–99)
Potassium: 3.8 mEq/L (ref 3.5–5.1)
Sodium: 140 mEq/L (ref 135–145)
Total Bilirubin: 0.5 mg/dL (ref 0.2–1.2)
Total Protein: 7.6 g/dL (ref 6.0–8.3)

## 2022-01-16 LAB — LIPID PANEL
Cholesterol: 199 mg/dL (ref 0–200)
HDL: 44.3 mg/dL (ref >39.00)
LDL Cholesterol: 140 mg/dL — ABNORMAL HIGH (ref 0–99)
NonHDL: 154.38
Total CHOL/HDL Ratio: 4
Triglycerides: 73 mg/dL (ref 0.0–149.0)
VLDL: 14.6 mg/dL (ref 0.0–40.0)

## 2022-01-16 LAB — T4, FREE: Free T4: 0.84 ng/dL (ref 0.60–1.60)

## 2022-01-16 LAB — TSH: TSH: 2.42 u[IU]/mL (ref 0.35–5.50)

## 2022-01-16 LAB — HEMOGLOBIN A1C: Hgb A1c MFr Bld: 5.3 % (ref 4.6–6.5)

## 2022-01-16 NOTE — Patient Instructions (Addendum)
Please return in 12 months for your annual complete physical; please come fasting.   I will release your lab results to you on your MyChart account with further instructions. You may see the results before I do, but when I review them I will send you a message with my report or have my assistant call you if things need to be discussed. Please reply to my message with any questions. Thank you!   Please call Dr. Kenna Gilbert office to get scheduled for a colonoscopy for colon cancer screening.   If you have any questions or concerns, please don't hesitate to send me a message via MyChart or call the office at [phone]. Thank you for visiting with [place] today! It's our pleasure caring for you.   I have ordered a mammogram and/or bone density for you as we discussed today: [x]   Mammogram  []   Bone Density  Please call the office checked below to schedule your appointment:  [x]   [facility]      [address] [place],        [address] Wing         []   [facility]  [address] [place],  [facility]  Colonoscopy, Adult A colonoscopy is a procedure to look at the entire large intestine. This procedure is done using a long, thin, flexible tube that has a camera on the end. You may have a colonoscopy: As a part of normal colorectal screening. If you have certain symptoms, such as: A low number of red blood cells in your blood (anemia). Diarrhea that does not go away. Pain in your abdomen. Blood in your stool. A colonoscopy can help screen for and diagnose medical problems, including: An abnormal growth of cells or tissue (tumor). Abnormal growths within the lining of your intestine (polyps). Inflammation. Areas of bleeding. Tell your health care provider about: Any allergies you have. All medicines you are taking, including vitamins, herbs, eye drops, creams, and over-the-counter medicines. Any problems you or family members  have had with anesthetic medicines. Any bleeding problems you have. Any surgeries you have had. Any medical conditions you have. Any problems you have had with having bowel movements. Whether you are pregnant or may be pregnant. What are the risks? Generally, this is a safe procedure. However, problems may occur, including: Bleeding. Damage to your intestine. Allergic reactions to medicines given during the procedure. Infection. This is rare. What happens before the procedure? Eating and drinking restrictions Follow instructions from your health care provider about eating or drinking restrictions, which may include: A few days before the procedure: Follow a low-fiber diet. Avoid nuts, seeds, dried fruit, raw fruits, and vegetables. 1-3 days before the procedure: Eat only gelatin dessert or ice pops. Drink only clear liquids, such as water, clear juice, clear broth or bouillon, black coffee or tea, or clear soft drinks or sports drinks. Avoid liquids that contain red or purple dye. The day of the procedure: Do not eat solid foods. You may continue to drink clear liquids until up to 2 hours before the procedure. Do not eat or drink anything starting 2 hours before the procedure, or within the time period that your health care provider recommends. Bowel prep If you were prescribed a bowel prep to take by mouth (orally) to clean out your colon: Take it as told by your health care provider. Starting the day before your procedure, you will  need to drink a large amount of liquid medicine. The liquid will cause you to have many bowel movements of loose stool until your stool becomes almost clear or light green. If your skin or the opening between the buttocks (anus) gets irritated from diarrhea, you may relieve the irritation using: Wipes with medicine in them, such as adult wet wipes with aloe and vitamin E. A product to soothe skin, such as petroleum jelly. If you vomit while drinking the  bowel prep: Take a break for up to 60 minutes. Begin the bowel prep again. Call your health care provider if you keep vomiting or you cannot take the bowel prep without vomiting. To clean out your colon, you may also be given: Laxative medicines. These help you have a bowel movement. Instructions for enema use. An enema is liquid medicine injected into your rectum. Medicines Ask your health care provider about: Changing or stopping your regular medicines or supplements. This is especially important if you are taking iron supplements, diabetes medicines, or blood thinners. Taking medicines such as aspirin and ibuprofen. These medicines can thin your blood. Do not take these medicines unless your health care provider tells you to take them. Taking over-the-counter medicines, vitamins, herbs, and supplements. General instructions Ask your health care provider what steps will be taken to help prevent infection. These may include washing skin with a germ-killing soap. If you will be going home right after the procedure, plan to have a responsible adult: Take you home from the hospital or clinic. You will not be allowed to drive. Care for you for the time you are told. What happens during the procedure?  An IV will be inserted into one of your veins. You will be given a medicine to make you fall asleep (general anesthetic). You will lie on your side with your knees bent. A lubricant will be put on the tube. Then the tube will be: Inserted into your anus. Gently eased through all parts of your large intestine. Air will be sent into your colon to keep it open. This may cause some pressure or cramping. Images will be taken with the camera and will appear on a screen. A small tissue sample may be removed to be looked at under a microscope (biopsy). The tissue may be sent to a lab for testing if any signs of problems are found. If small polyps are found, they may be removed and checked for cancer  cells. When the procedure is finished, the tube will be removed. The procedure may vary among health care providers and hospitals. What happens after the procedure? Your blood pressure, heart rate, breathing rate, and blood oxygen level will be monitored until you leave the hospital or clinic. You may have a small amount of blood in your stool. You may pass gas and have mild cramping or bloating in your abdomen. This is caused by the air that was used to open your colon during the exam. If you were given a sedative during the procedure, it can affect you for several hours. Do not drive or operate machinery until your health care provider says that it is safe. It is up to you to get the results of your procedure. Ask your health care provider, or the department that is doing the procedure, when your results will be ready. Summary A colonoscopy is a procedure to look at the entire large intestine. Follow instructions from your health care provider about eating and drinking before the procedure. If you were prescribed  an oral bowel prep to clean out your colon, take it as told by your health care provider. During the colonoscopy, a flexible tube with a camera on its end is inserted into the anus and then passed into all parts of the large intestine. This information is not intended to replace advice given to you by your health care provider. Make sure you discuss any questions you have with your health care provider. Document Revised: 02/12/2021 Document Reviewed: 10/11/2020 Elsevier Patient Education  [employer].

## 2022-01-16 NOTE — Progress Notes (Signed)
Subjective  Chief Complaint  Patient presents with   Annual Exam    Pt here for Annual exam and is currently fasting     HPI: Adrienne Summers is a 51 y.o. female who presents to [facility] [place] today for a Female Wellness Visit. She also has the concerns and/or needs as listed above in the chief complaint. These will be addressed in addition to the Health Maintenance Visit.   Wellness Visit: annual visit with health maintenance review and exam without Pap  HM: due mammogram and colonoscopy. [facility]. Declines vaccines. Chronic disease f/u and/or acute problem visit: (deemed necessary to be done in addition to the wellness visit): Seeing hormone and weight loss specialist in [place]: has lost 60 pounds in a year on ozempic 0.25 mg weekly and on testosterone and cytolmel. Feels great about it.  GERD: no longer needing meds  IBS is controlled by diet Subclinical hypothyroidism now on cytomel. Due for recheck.  Panic d/o is well controlled on sertraline 50. No mood concerns. No panic attacks. No adverse effects.    Assessment  1. Annual physical exam   2. Fibromyalgia   3. Interstitial cystitis   4. Gastroesophageal reflux disease without esophagitis   5. Subclinical hypothyroidism   6. Panic disorder   7. Irritable bowel syndrome, unspecified type   8. Encounter for colorectal cancer screening   9. Morbid obesity [facility])      Plan  Female Wellness Visit: Age appropriate Health Maintenance and Prevention measures were discussed with patient. Included topics are cancer screening recommendations, ways to keep healthy (see AVS) including dietary and exercise recommendations, regular eye and dental care, use of seat belts, and avoidance of moderate alcohol use and tobacco use. To see gyn soon. To schedule mammogram and colonoscopy with Dr. Loreta Ave BMI: discussed patient's BMI and encouraged positive lifestyle modifications to help get to or  maintain a target BMI. HM needs and immunizations were addressed and ordered. See below for orders. See HM and immunization section for updates.declines Routine labs and screening tests ordered including cmp, cbc and lipids where appropriate. Discussed recommendations regarding Vit D and calcium supplementation (see AVS)  Chronic disease management visit and/or acute problem visit: Obesity: excellent response to glp-1 and diet changes. Continue ozempic 0.25 and testosterone Recheck thyroid levels on cytomel IBS and fibromyalgia have improved with better diet and weight loss GERD: rare sxs now  Follow up: 12 mo for cpe  Orders Placed This Encounter  Procedures   CBC with Differential/Platelet   Comprehensive metabolic panel   Hemoglobin A1c   Lipid panel   TSH   T3   T4, free   No orders of the defined types were placed in this encounter.     Body mass index is 36.74 kg/m. Wt Readings from Last 3 Encounters:  01/16/22 207 lb 6.4 oz (94.1 kg)  10/17/21 217 lb 8 oz (98.7 kg)  05/01/20 261 lb 12.8 oz (118.8 kg)     Patient Active Problem List   Diagnosis Date Noted   COVID-19 vaccination declined 03/09/2020   Abnormal EKG 01/29/2020   Rosacea 04/28/2019   Fibrocystic breast changes, bilateral 04/28/2019   Fibromyalgia 04/13/2018    Dxd in 2001; evaluated by rheumatologist, Dr. Phylliss Bob; also has seen Dr. Corliss Skains but was not a good fit. Failed Cymbalta due to side effects    Interstitial cystitis 04/13/2018    Managed by Dr. Logan Bores    GERD (gastroesophageal reflux  disease) 04/13/2018   Subclinical hypothyroidism 04/13/2018   Panic disorder 04/13/2018    Started in 1998; had been on low dose zoloft, and prn xanax    IBS (irritable bowel syndrome) 04/13/2018   Health Maintenance  Topic Date Due   COLONOSCOPY (Pts 45-96yrs Insurance coverage will need to be confirmed)  Never done   MAMMOGRAM  12/06/2021   COVID-19 Vaccine (1) 02/01/2022 (Originally 12/15/1975)    INFLUENZA VACCINE  06/02/2022 (Originally 10/02/2021)   PAP SMEAR-Modifier  03/31/2022   TETANUS/TDAP  04/27/2029   Hepatitis C Screening  Completed   HIV Screening  Completed   HPV VACCINES  Aged Out   Immunization History  Administered Date(s) Administered   Tdap 04/28/2019   We updated and reviewed the patient's past history in detail and it is documented below. Allergies: Patient is allergic to contrast media [iodinated contrast media], codeine, tape, penicillins, sulfa antibiotics, and sulfamethoxazole. Past Medical History Patient  has a past medical history of Anxiety, Endometriosis, Fibromyalgia, GERD (gastroesophageal reflux disease) (04/13/2018), IBS (irritable bowel syndrome), Interstitial cystitis (04/13/2018), Morbid obesity (HCC) (04/13/2018), Panic disorder (04/13/2018), Pneumonia due to COVID-19 virus (01/28/2020), and Thyroid disease. Past Surgical History Patient  has a past surgical history that includes Endometrial ablation. Family History: Patient family history includes Cancer in her brother, maternal grandmother, mother, and paternal grandmother; Healthy in her daughter; Hearing loss in her father and mother; Heart attack in her maternal grandfather and maternal grandmother; High Cholesterol in her father, mother, paternal grandfather, and paternal grandmother; High blood pressure in her maternal grandmother and mother. Social History:  Patient  reports that she has never smoked. She has never used smokeless tobacco. She reports current alcohol use. She reports that she does not use drugs.  Review of Systems: Constitutional: negative for fever or malaise Ophthalmic: negative for photophobia, double vision or loss of vision Cardiovascular: negative for chest pain, dyspnea on exertion, or new LE swelling Respiratory: negative for SOB or persistent cough Gastrointestinal: negative for abdominal pain, change in bowel habits or melena Genitourinary: negative for dysuria or  gross hematuria, no abnormal uterine bleeding or disharge Musculoskeletal: negative for new gait disturbance or muscular weakness Integumentary: negative for new or persistent rashes, no breast lumps Neurological: negative for TIA or stroke symptoms Psychiatric: negative for SI or delusions Allergic/Immunologic: negative for hives  Patient Care Team    Relationship Specialty Notifications Start End  Willow Ora, MD PCP - General Family Medicine  04/13/18   Charna Elizabeth, MD Consulting Physician Gastroenterology  04/13/18   Freddy Finner, MD (Inactive) Consulting Physician Obstetrics and Gynecology  04/28/19     Objective  Vitals: BP 122/80   Pulse 74   Temp 98 F (36.7 C)   Ht 5\' 3"  (1.6 m)   Wt 207 lb 6.4 oz (94.1 kg)   SpO2 98%   BMI 36.74 kg/m  General:  Well developed, well nourished, no acute distress  Psych:  Alert and orientedx3,normal mood and affect HEENT:  Normocephalic, atraumatic, non-icteric sclera,  supple neck without adenopathy, mass or thyromegaly Cardiovascular:  Normal S1, S2, RRR without gallop, rub or murmur Respiratory:  Good breath sounds bilaterally, CTAB with normal respiratory effort Gastrointestinal: normal bowel sounds, soft, non-tender, no noted masses. No HSM MSK: no deformities, contusions. Joints are without erythema or swelling.  Skin:  Warm, no rashes or suspicious lesions noted Neurologic:    Mental status is normal. CN 2-11 are normal. Gross motor and sensory exams are normal. Normal gait. No  tremor   Commons side effects, risks, benefits, and alternatives for medications and treatment plan prescribed today were discussed, and the patient expressed understanding of the given instructions. Patient is instructed to call or message via MyChart if he/she has any questions or concerns regarding our treatment plan. No barriers to understanding were identified. We discussed Red Flag symptoms and signs in detail. Patient expressed understanding  regarding what to do in case of urgent or emergency type symptoms.  Medication list was reconciled, printed and provided to the patient in AVS. Patient instructions and summary information was reviewed with the patient as documented in the AVS. This note was prepared with assistance of Dragon voice recognition software. Occasional wrong-word or sound-a-like substitutions may have occurred due to the inherent limitations of voice recognition software

## 2022-01-17 ENCOUNTER — Encounter: Payer: Self-pay | Admitting: Family Medicine

## 2022-01-17 LAB — T3: T3, Total: 90 ng/dL (ref 76–181)

## 2022-02-01 DIAGNOSIS — Z01419 Encounter for gynecological examination (general) (routine) without abnormal findings: Secondary | ICD-10-CM | POA: Diagnosis not present

## 2022-02-01 DIAGNOSIS — R319 Hematuria, unspecified: Secondary | ICD-10-CM | POA: Diagnosis not present

## 2022-02-01 DIAGNOSIS — Z6836 Body mass index (BMI) 36.0-36.9, adult: Secondary | ICD-10-CM | POA: Diagnosis not present

## 2022-02-01 DIAGNOSIS — Z1151 Encounter for screening for human papillomavirus (HPV): Secondary | ICD-10-CM | POA: Diagnosis not present

## 2022-02-01 DIAGNOSIS — Z124 Encounter for screening for malignant neoplasm of cervix: Secondary | ICD-10-CM | POA: Diagnosis not present

## 2022-02-06 LAB — HM PAP SMEAR: HPV, high-risk: NEGATIVE

## 2022-02-12 ENCOUNTER — Ambulatory Visit: Payer: Self-pay | Admitting: Physician Assistant

## 2022-03-01 ENCOUNTER — Ambulatory Visit (INDEPENDENT_AMBULATORY_CARE_PROVIDER_SITE_OTHER): Payer: BC Managed Care – PPO | Admitting: Nurse Practitioner

## 2022-03-01 ENCOUNTER — Encounter: Payer: Self-pay | Admitting: Nurse Practitioner

## 2022-03-01 VITALS — BP 122/82 | HR 94 | Temp 97.3°F | Ht 63.0 in | Wt 204.2 lb

## 2022-03-01 DIAGNOSIS — J0110 Acute frontal sinusitis, unspecified: Secondary | ICD-10-CM | POA: Diagnosis not present

## 2022-03-01 MED ORDER — LIDOCAINE VISCOUS HCL 2 % MT SOLN: 5.0000 mL | Freq: Four times a day (QID) | OROMUCOSAL | 0 refills | Status: DC | PRN

## 2022-03-01 MED ORDER — DOXYCYCLINE HYCLATE 100 MG PO TABS: 100.0000 mg | ORAL_TABLET | Freq: Two times a day (BID) | ORAL | 0 refills | Status: DC

## 2022-03-01 MED ORDER — FLUCONAZOLE 150 MG PO TABS: ORAL_TABLET | ORAL | 0 refills | Status: DC

## 2022-03-01 NOTE — Progress Notes (Signed)
Acute Office Visit  Subjective:     Patient ID: Adrienne Summers, female    DOB: 01-20-1971, 51 y.o.   MRN: [id number]  Chief Complaint  Patient presents with   Acute Visit    C/o ongoing dry cough, sore throat, nasal congestion x 4 weeks    HPI Patient is in today for cough, sore throat, and nasal congestion for 4 weeks.   UPPER RESPIRATORY TRACT INFECTION  Fever: no Cough: yes - at first Shortness of breath: no Wheezing: no Chest pain: no Chest tightness: no Chest congestion: no Nasal congestion: yes Runny nose: no Post nasal drip: yes Sneezing: no Sore throat: yes Swollen glands: no Sinus pressure: yes Headache: yes Face pain: no Toothache: no Ear pain: no bilateral Ear pressure: yes bilateral Eyes red/itching:no Eye drainage/crusting: no  Vomiting: no - is having some nausea Rash: no Fatigue: yes Sick contacts: yes - husband Strep contacts: no  Context: worse Recurrent sinusitis: no Relief with OTC cold/cough medications: no  Treatments attempted: advil sinus and congestion, zyrtec   ROS See pertinent positives and negatives per HPI.     Objective:    BP 122/82 (BP Location: Right Arm, Patient Position: Sitting, Cuff Size: Normal)   Pulse 94   Temp (!) 97.3 F (36.3 C) (Temporal)   Ht 5\' 3"  (1.6 m)   Wt 204 lb 3.2 oz (92.6 kg)   SpO2 98%   BMI 36.17 kg/m    Physical Exam Vitals and nursing note reviewed.  Constitutional:      General: She is not in acute distress.    Appearance: Normal appearance.  HENT:     Head: Normocephalic.     Right Ear: Tympanic membrane, ear canal and external ear normal.     Left Ear: Tympanic membrane, ear canal and external ear normal.     Nose:     Right Sinus: Frontal sinus tenderness present. No maxillary sinus tenderness.     Left Sinus: Frontal sinus tenderness present. No maxillary sinus tenderness.     Mouth/Throat:     Mouth: Mucous membranes are moist.     Pharynx: Posterior oropharyngeal  erythema present. No oropharyngeal exudate.  Eyes:     Conjunctiva/sclera: Conjunctivae normal.  Cardiovascular:     Rate and Rhythm: Normal rate and regular rhythm.     Pulses: Normal pulses.     Heart sounds: Normal heart sounds.  Pulmonary:     Effort: Pulmonary effort is normal.     Breath sounds: Normal breath sounds.  Musculoskeletal:     Cervical back: Normal range of motion and neck supple. No tenderness.  Lymphadenopathy:     Cervical: No cervical adenopathy.  Skin:    General: Skin is warm.  Neurological:     General: No focal deficit present.     Mental Status: She is alert and oriented to person, place, and time.  Psychiatric:        Mood and Affect: Mood normal.        Behavior: Behavior normal.        Thought Content: Thought content normal.        Judgment: Judgment normal.       Assessment & Plan:   Problem List Items Addressed This Visit   None Visit Diagnoses     Acute non-recurrent frontal sinusitis    -  Primary   Will start doxycycline BIDx10 days. diflucan to prevent yeast infection. Magic mouthwash gargle/swallow QID prn sore throat. F/U if not  improving.   Relevant Medications   doxycycline (VIBRA-TABS) 100 MG tablet   fluconazole (DIFLUCAN) 150 MG tablet   magic mouthwash (lidocaine, diphenhydrAMINE, alum & mag hydroxide) suspension       Meds ordered this encounter  Medications   doxycycline (VIBRA-TABS) 100 MG tablet    Sig: Take 1 tablet (100 mg total) by mouth 2 (two) times daily.    Dispense:  20 tablet    Refill:  0   fluconazole (DIFLUCAN) 150 MG tablet    Sig: Take 1 pill at the end of your antibiotics and a second pill 3 days later if needed.    Dispense:  2 tablet    Refill:  0   magic mouthwash (lidocaine, diphenhydrAMINE, alum & mag hydroxide) suspension    Sig: Swish and swallow 5 mLs 4 (four) times daily as needed for mouth pain.    Dispense:  360 mL    Refill:  0    Return if symptoms worsen or fail to  improve.  Gerre Scull, NP

## 2022-03-01 NOTE — Patient Instructions (Signed)
It was great to see you!  Start doxycycline antibiotic twice a day with food.   You can use the magic mouthwash 4 times a day as needed for sore throat. Gargle and swallow the medicine.   Keep taking the advil sinus and zyrtec.   Let's follow-up if your symptoms worsen or don't improve.   Take care,  Rodman Pickle, NP

## 2022-03-06 ENCOUNTER — Other Ambulatory Visit: Payer: Self-pay | Admitting: Family Medicine

## 2022-03-07 MED ORDER — ALPRAZOLAM 0.25 MG PO TABS: 0.2500 mg | ORAL_TABLET | Freq: Every day | ORAL | 0 refills | Status: AC | PRN

## 2022-03-20 ENCOUNTER — Ambulatory Visit
Admission: RE | Admit: 2022-03-20 | Discharge: 2022-03-20 | Disposition: A | Discharge status: Home / Self Care | Payer: 59 | Source: Ambulatory Visit | Attending: Obstetrics and Gynecology | Admitting: Obstetrics and Gynecology

## 2022-03-20 DIAGNOSIS — Z1239 Encounter for other screening for malignant neoplasm of breast: Secondary | ICD-10-CM

## 2022-03-20 DIAGNOSIS — Z1231 Encounter for screening mammogram for malignant neoplasm of breast: Secondary | ICD-10-CM | POA: Diagnosis not present

## 2022-09-14 DIAGNOSIS — R079 Chest pain, unspecified: Secondary | ICD-10-CM | POA: Diagnosis not present

## 2022-10-07 ENCOUNTER — Other Ambulatory Visit: Payer: Self-pay | Admitting: Family Medicine

## 2023-01-20 ENCOUNTER — Ambulatory Visit (INDEPENDENT_AMBULATORY_CARE_PROVIDER_SITE_OTHER): Payer: 59 | Admitting: Family Medicine

## 2023-01-20 ENCOUNTER — Encounter: Payer: Self-pay | Admitting: Family Medicine

## 2023-01-20 VITALS — BP 122/86 | HR 92 | Temp 98.3°F | Ht 63.0 in | Wt 184.4 lb

## 2023-01-20 DIAGNOSIS — E038 Other specified hypothyroidism: Secondary | ICD-10-CM

## 2023-01-20 DIAGNOSIS — F410 Panic disorder [episodic paroxysmal anxiety] without agoraphobia: Secondary | ICD-10-CM

## 2023-01-20 DIAGNOSIS — Z0001 Encounter for general adult medical examination with abnormal findings: Secondary | ICD-10-CM | POA: Diagnosis not present

## 2023-01-20 DIAGNOSIS — M797 Fibromyalgia: Secondary | ICD-10-CM | POA: Diagnosis not present

## 2023-01-20 DIAGNOSIS — Z1211 Encounter for screening for malignant neoplasm of colon: Secondary | ICD-10-CM

## 2023-01-20 DIAGNOSIS — Z8639 Personal history of other endocrine, nutritional and metabolic disease: Secondary | ICD-10-CM | POA: Insufficient documentation

## 2023-01-20 DIAGNOSIS — K589 Irritable bowel syndrome without diarrhea: Secondary | ICD-10-CM | POA: Diagnosis not present

## 2023-01-20 DIAGNOSIS — Z0000 Encounter for general adult medical examination without abnormal findings: Secondary | ICD-10-CM

## 2023-01-20 LAB — LIPID PANEL
Cholesterol: 217 mg/dL — ABNORMAL HIGH (ref 0–200)
HDL: 54 mg/dL (ref >39.00)
LDL Cholesterol: 149 mg/dL — ABNORMAL HIGH (ref 0–99)
NonHDL: 162.84
Total CHOL/HDL Ratio: 4
Triglycerides: 70 mg/dL (ref 0.0–149.0)
VLDL: 14 mg/dL (ref 0.0–40.0)

## 2023-01-20 LAB — COMPREHENSIVE METABOLIC PANEL
ALT: 17 U/L (ref 0–35)
AST: 17 U/L (ref 0–37)
Albumin: 4.3 g/dL (ref 3.5–5.2)
Alkaline Phosphatase: 94 U/L (ref 39–117)
BUN: 15 mg/dL (ref 6–23)
CO2: 28 meq/L (ref 19–32)
Calcium: 9.9 mg/dL (ref 8.4–10.5)
Chloride: 104 meq/L (ref 96–112)
Creatinine, Ser: 0.83 mg/dL (ref 0.40–1.20)
GFR: 82.39 mL/min (ref >60.00)
Glucose, Bld: 91 mg/dL (ref 70–99)
Potassium: 4.4 meq/L (ref 3.5–5.1)
Sodium: 141 meq/L (ref 135–145)
Total Bilirubin: 0.5 mg/dL (ref 0.2–1.2)
Total Protein: 7.6 g/dL (ref 6.0–8.3)

## 2023-01-20 LAB — CBC WITH DIFFERENTIAL/PLATELET
Basophils Absolute: 0 10*3/uL (ref 0.0–0.1)
Basophils Relative: 0.8 % (ref 0.0–3.0)
Eosinophils Absolute: 0.1 10*3/uL (ref 0.0–0.7)
Eosinophils Relative: 3.2 % (ref 0.0–5.0)
HCT: 45.6 % (ref 36.0–46.0)
Hemoglobin: 15.4 g/dL — ABNORMAL HIGH (ref 12.0–15.0)
Lymphocytes Relative: 26.3 % (ref 12.0–46.0)
Lymphs Abs: 1.2 10*3/uL (ref 0.7–4.0)
MCHC: 33.7 g/dL (ref 30.0–36.0)
MCV: 92.2 fL (ref 78.0–100.0)
Monocytes Absolute: 0.3 10*3/uL (ref 0.1–1.0)
Monocytes Relative: 6.4 % (ref 3.0–12.0)
Neutro Abs: 3 10*3/uL (ref 1.4–7.7)
Neutrophils Relative %: 63.3 % (ref 43.0–77.0)
Platelets: 319 10*3/uL (ref 150.0–400.0)
RBC: 4.94 Mil/uL (ref 3.87–5.11)
RDW: 13.2 % (ref 11.5–15.5)
WBC: 4.7 10*3/uL (ref 4.0–10.5)

## 2023-01-20 LAB — TSH: TSH: 3.41 u[IU]/mL (ref 0.35–5.50)

## 2023-01-20 LAB — T4, FREE: Free T4: 0.87 ng/dL (ref 0.60–1.60)

## 2023-01-20 NOTE — Patient Instructions (Addendum)
Please return in 12 months for your annual complete physical; please come fasting.   I will release your lab results to you on your MyChart account with further instructions. You may see the results before I do, but when I review them I will send you a message with my report or have my assistant call you if things need to be discussed. Please reply to my message with any questions. Thank you!   If you have any questions or concerns, please don't hesitate to send me a message via MyChart or call the office at [phone]. Thank you for visiting with [place] today! It's our pleasure caring for you.   Preventive Care 53-71 Years Old, Female Preventive care refers to lifestyle choices and visits with your health care provider that can promote health and wellness. Preventive care visits are also called wellness exams. What can I expect for my preventive care visit? Counseling Your health care provider may ask you questions about your: Medical history, including: Past medical problems. Family medical history. Pregnancy history. Current health, including: Menstrual cycle. Method of birth control. Emotional well-being. Home life and relationship well-being. Sexual activity and sexual health. Lifestyle, including: Alcohol, nicotine or tobacco, and drug use. Access to firearms. Diet, exercise, and sleep habits. Work and work Astronomer. Sunscreen use. Safety issues such as seatbelt and bike helmet use. Physical exam Your health care provider will check your: Height and weight. These may be used to calculate your BMI (body mass index). BMI is a measurement that tells if you are at a healthy weight. Waist circumference. This measures the distance around your waistline. This measurement also tells if you are at a healthy weight and may help predict your risk of certain diseases, such as type 2 diabetes and high blood pressure. Heart rate and blood pressure. Body temperature. Skin for abnormal  spots. What immunizations do I need?  Vaccines are usually given at various ages, according to a schedule. Your health care provider will recommend vaccines for you based on your age, medical history, and lifestyle or other factors, such as travel or where you work. What tests do I need? Screening Your health care provider may recommend screening tests for certain conditions. This may include: Lipid and cholesterol levels. Diabetes screening. This is done by checking your blood sugar (glucose) after you have not eaten for a while (fasting). Pelvic exam and Pap test. Hepatitis B test. Hepatitis C test. HIV (human immunodeficiency virus) test. STI (sexually transmitted infection) testing, if you are at risk. Lung cancer screening. Colorectal cancer screening. Mammogram. Talk with your health care provider about when you should start having regular mammograms. This may depend on whether you have a family history of breast cancer. BRCA-related cancer screening. This may be done if you have a family history of breast, ovarian, tubal, or peritoneal cancers. Bone density scan. This is done to screen for osteoporosis. Talk with your health care provider about your test results, treatment options, and if necessary, the need for more tests. Follow these instructions at home: Eating and drinking  Eat a diet that includes fresh fruits and vegetables, whole grains, lean protein, and low-fat dairy products. Take vitamin and mineral supplements as recommended by your health care provider. Do not drink alcohol if: Your health care provider tells you not to drink. You are pregnant, may be pregnant, or are planning to become pregnant. If you drink alcohol: Limit how much you have to 0-1 drink a day. Know how much alcohol is in your  drink. In the [place] one drink equals one 12 oz bottle of beer (355 mL), one 5 oz glass of wine (148 mL), or one 1 oz glass of hard liquor (44 mL). Lifestyle Brush your  teeth every morning and night with fluoride toothpaste. Floss one time each day. Exercise for at least 30 minutes 5 or more days each week. Do not use any products that contain nicotine or tobacco. These products include cigarettes, chewing tobacco, and vaping devices, such as e-cigarettes. If you need help quitting, ask your health care provider. Do not use drugs. If you are sexually active, practice safe sex. Use a condom or other form of protection to prevent STIs. If you do not wish to become pregnant, use a form of birth control. If you plan to become pregnant, see your health care provider for a prepregnancy visit. Take aspirin only as told by your health care provider. Make sure that you understand how much to take and what form to take. Work with your health care provider to find out whether it is safe and beneficial for you to take aspirin daily. Find healthy ways to manage stress, such as: Meditation, yoga, or listening to music. Journaling. Talking to a trusted person. Spending time with friends and family. Minimize exposure to UV radiation to reduce your risk of skin cancer. Safety Always wear your seat belt while driving or riding in a vehicle. Do not drive: If you have been drinking alcohol. Do not ride with someone who has been drinking. When you are tired or distracted. While texting. If you have been using any mind-altering substances or drugs. Wear a helmet and other protective equipment during sports activities. If you have firearms in your house, make sure you follow all gun safety procedures. Seek help if you have been physically or sexually abused. What's next? Visit your health care provider once a year for an annual wellness visit. Ask your health care provider how often you should have your eyes and teeth checked. Stay up to date on all vaccines. This information is not intended to replace advice given to you by your health care provider. Make sure you discuss any  questions you have with your health care provider. Document Revised: 08/16/2020 Document Reviewed: 08/16/2020 Elsevier Patient Education  2024 [employer].

## 2023-01-20 NOTE — Progress Notes (Signed)
Subjective  Chief Complaint  Patient presents with   Annual Exam    Pt here for Annual Exam and is currently fasting       HPI: Adrienne Summers is a 52 y.o. female who presents to [facility] [place] today for a Female Wellness Visit. She also has the concerns and/or needs as listed above in the chief complaint. These will be addressed in addition to the Health Maintenance Visit.   Wellness Visit: annual visit with health maintenance review and exam.   HM: due colonoscopy: to schedule with Dr. Loreta Ave. [facility] Reports had Pap smear in February 2023.  Will call for records.  Reports it was normal at that time.  Has no concerns at this time. Immunizations: Has had severe reactions to flu shots in the past.  Defers influenza vaccines and also Shingrix.  Education given Chronic disease f/u and/or acute problem visit: (deemed necessary to be done in addition to the wellness visit): Subclinical hypothyroidism: Has stopped Cytomel.  Clinically feels well.  Due for recheck. Panic disorder is well-controlled on sertraline 50 mg daily. IBS and fibromyalgia are stable BMI now 32.  History of morbid obesity.  She has lost over 90 pounds with the wellness clinic.  Assessment  1. Encounter for well adult exam with abnormal findings   2. Screening for colorectal cancer   3. Subclinical hypothyroidism   4. Panic disorder   5. Irritable bowel syndrome, unspecified type   6. Fibromyalgia   7. History of morbid obesity      Plan  Female Wellness Visit: Age appropriate Health Maintenance and Prevention measures were discussed with patient. Included topics are cancer screening recommendations, ways to keep healthy (see AVS) including dietary and exercise recommendations, regular eye and dental care, use of seat belts, and avoidance of moderate alcohol use and tobacco use.  Patient to call Dr. Kenna Gilbert office to schedule colonoscopy.  Pap smear and mammogram are  current BMI: discussed patient's BMI and encouraged positive lifestyle modifications to help get to or maintain a target BMI. HM needs and immunizations were addressed and ordered. See below for orders. See HM and immunization section for updates.  Defers flu vaccine and Shingrix vaccination at this time.  No true contraindications, can consider Shingrix in the future Routine labs and screening tests ordered including cmp, cbc and lipids where appropriate. Discussed recommendations regarding Vit D and calcium supplementation (see AVS)  Chronic disease management visit and/or acute problem visit: Recheck thyroid panel.  Clinically euthyroid Continue sertraline 50 mg daily for panic disorder which is well-controlled IBS and fibromyalgia are stable.  Continue behavioral management strategies. Continue wellness clinic follow-up for weight loss medications.  Has had amazing success with weight loss.  Congratulated.  Follow up: 12 months for complete physical Orders Placed This Encounter  Procedures   CBC with Differential/Platelet   Comprehensive metabolic panel   Lipid panel   TSH   T3   T4, free   No orders of the defined types were placed in this encounter.     Body mass index is 32.66 kg/m. Wt Readings from Last 3 Encounters:  01/20/23 184 lb 6.4 oz (83.6 kg)  03/01/22 204 lb 3.2 oz (92.6 kg)  01/16/22 207 lb 6.4 oz (94.1 kg)     Patient Active Problem List   Diagnosis Date Noted Date Diagnosed   History of morbid obesity 01/20/2023    COVID-19 vaccination declined 03/09/2020    Abnormal EKG 01/29/2020  Rosacea 04/28/2019    Fibrocystic breast changes, bilateral 04/28/2019    Fibromyalgia 04/13/2018     Dxd in 2001; evaluated by rheumatologist, Dr. Phylliss Bob; also has seen Dr. Corliss Skains but was not a good fit. Failed Cymbalta due to side effects    Interstitial cystitis 04/13/2018     Managed by Dr. Logan Bores    GERD (gastroesophageal reflux disease) 04/13/2018    Subclinical  hypothyroidism 04/13/2018    Panic disorder 04/13/2018     Started in 1998; had been on low dose zoloft, and prn xanax    IBS (irritable bowel syndrome) 04/13/2018    Health Maintenance  Topic Date Due   Colonoscopy  Never done   COVID-19 Vaccine (1 - 2023-24 season) 02/05/2023 (Originally 11/03/2022)   Zoster Vaccines- Shingrix (1 of 2) 04/22/2023 (Originally 12/16/2022)   INFLUENZA VACCINE  06/02/2023 (Originally 10/03/2022)   MAMMOGRAM  03/21/2023   Cervical Cancer Screening (HPV/Pap Cotest)  03/31/2024   DTaP/Tdap/Td (3 - Td or Tdap) 04/27/2029   Hepatitis C Screening  Completed   HIV Screening  Completed   HPV VACCINES  Aged Out   Immunization History  Administered Date(s) Administered   Tdap 01/16/2009, 04/28/2019   We updated and reviewed the patient's past history in detail and it is documented below. Allergies: Patient is allergic to contrast media [iodinated contrast media], codeine, tape, penicillins, sulfa antibiotics, and sulfamethoxazole. Past Medical History Patient  has a past medical history of Anxiety, Endometriosis, Fibromyalgia, GERD (gastroesophageal reflux disease) (04/13/2018), IBS (irritable bowel syndrome), Interstitial cystitis (04/13/2018), Morbid obesity (HCC) (04/13/2018), Panic disorder (04/13/2018), Pneumonia due to COVID-19 virus (01/28/2020), and Thyroid disease. Past Surgical History Patient  has a past surgical history that includes Endometrial ablation. Family History: Patient family history includes Cancer in her brother, maternal grandmother, mother, and paternal grandmother; Healthy in her daughter; Hearing loss in her father and mother; Heart attack in her maternal grandfather and maternal grandmother; High Cholesterol in her father, mother, paternal grandfather, and paternal grandmother; High blood pressure in her maternal grandmother and mother. Social History:  Patient  reports that she has never smoked. She has never used smokeless tobacco. She  reports current alcohol use. She reports that she does not use drugs.  Review of Systems: Constitutional: negative for fever or malaise Ophthalmic: negative for photophobia, double vision or loss of vision Cardiovascular: negative for chest pain, dyspnea on exertion, or new LE swelling Respiratory: negative for SOB or persistent cough Gastrointestinal: negative for abdominal pain, change in bowel habits or melena Genitourinary: negative for dysuria or gross hematuria, no abnormal uterine bleeding or disharge Musculoskeletal: negative for new gait disturbance or muscular weakness Integumentary: negative for new or persistent rashes, no breast lumps Neurological: negative for TIA or stroke symptoms Psychiatric: negative for SI or delusions Allergic/Immunologic: negative for hives  Patient Care Team    Relationship Specialty Notifications Start End  Willow Ora, MD PCP - General Family Medicine  04/13/18   Charna Elizabeth, MD Consulting Physician Gastroenterology  04/13/18   Freddy Finner, MD (Inactive) Consulting Physician Obstetrics and Gynecology  04/28/19     Objective  Vitals: BP 122/86   Pulse 92   Temp 98.3 F (36.8 C)   Ht 5\' 3"  (1.6 m)   Wt 184 lb 6.4 oz (83.6 kg)   SpO2 98%   BMI 32.66 kg/m  General:  Well developed, well nourished, no acute distress  Psych:  Alert and orientedx3,normal mood and affect HEENT:  Normocephalic, atraumatic, non-icteric sclera,  supple  neck without adenopathy, mass or thyromegaly Cardiovascular:  Normal S1, S2, RRR without gallop, rub or murmur Respiratory:  Good breath sounds bilaterally, CTAB with normal respiratory effort Gastrointestinal: normal bowel sounds, soft, non-tender, no noted masses. No HSM MSK: extremities without edema, joints without erythema or swelling Neurologic:    Mental status is normal.  Gross motor and sensory exams are normal.  No tremor  Commons side effects, risks, benefits, and alternatives for medications and  treatment plan prescribed today were discussed, and the patient expressed understanding of the given instructions. Patient is instructed to call or message via MyChart if he/she has any questions or concerns regarding our treatment plan. No barriers to understanding were identified. We discussed Red Flag symptoms and signs in detail. Patient expressed understanding regarding what to do in case of urgent or emergency type symptoms.  Medication list was reconciled, printed and provided to the patient in AVS. Patient instructions and summary information was reviewed with the patient as documented in the AVS. This note was prepared with assistance of Dragon voice recognition software. Occasional wrong-word or sound-a-like substitutions may have occurred due to the inherent limitations of voice recognition software

## 2023-01-21 LAB — T3: T3, Total: 87 ng/dL (ref 76–181)

## 2023-01-23 NOTE — Progress Notes (Signed)
Labs reviewed.  All normal The 10-year ASCVD risk score (Arnett DK, et al., 2019) is: 1.3%   Values used to calculate the score:     Age: 52 years     Sex: Female     Is Non-Hispanic African American: No     Diabetic: No     Tobacco smoker: No     Systolic Blood Pressure: 122 mmHg     Is BP treated: No     HDL Cholesterol: 54 mg/dL     Total Cholesterol: 217 mg/dL

## 2023-02-07 ENCOUNTER — Other Ambulatory Visit: Payer: Self-pay | Admitting: Family Medicine

## 2023-02-07 DIAGNOSIS — Z1231 Encounter for screening mammogram for malignant neoplasm of breast: Secondary | ICD-10-CM

## 2023-02-26 ENCOUNTER — Other Ambulatory Visit: Payer: Self-pay | Admitting: Family Medicine

## 2023-03-25 DIAGNOSIS — Z1231 Encounter for screening mammogram for malignant neoplasm of breast: Secondary | ICD-10-CM

## 2023-04-11 ENCOUNTER — Ambulatory Visit
Admission: RE | Admit: 2023-04-11 | Discharge: 2023-04-11 | Disposition: A | Discharge status: Home / Self Care | Payer: Commercial Managed Care - HMO | Source: Ambulatory Visit | Attending: Family Medicine | Admitting: Family Medicine

## 2023-04-11 DIAGNOSIS — Z1231 Encounter for screening mammogram for malignant neoplasm of breast: Secondary | ICD-10-CM

## 2023-05-24 ENCOUNTER — Other Ambulatory Visit: Payer: Self-pay | Admitting: Family Medicine

## 2023-08-17 ENCOUNTER — Other Ambulatory Visit: Payer: Self-pay | Admitting: Family Medicine

## 2023-10-03 ENCOUNTER — Ambulatory Visit: Admitting: Family Medicine

## 2023-10-03 IMAGING — MG MM DIGITAL SCREENING BILAT W/ TOMO AND CAD
6 of 10 series · 6 of 30 positions shown · non-contrast
Comparison: Previous exam(s).

CLINICAL DATA: Screening.

EXAM:
DIGITAL SCREENING BILATERAL MAMMOGRAM WITH TOMOSYNTHESIS AND CAD
TECHNIQUE: Bilateral screening digital craniocaudal and mediolateral oblique
mammograms were obtained. Bilateral screening digital breast
tomosynthesis was performed. The images were evaluated with
computer-aided detection.

[R CC synth-2D (1 of 2)]
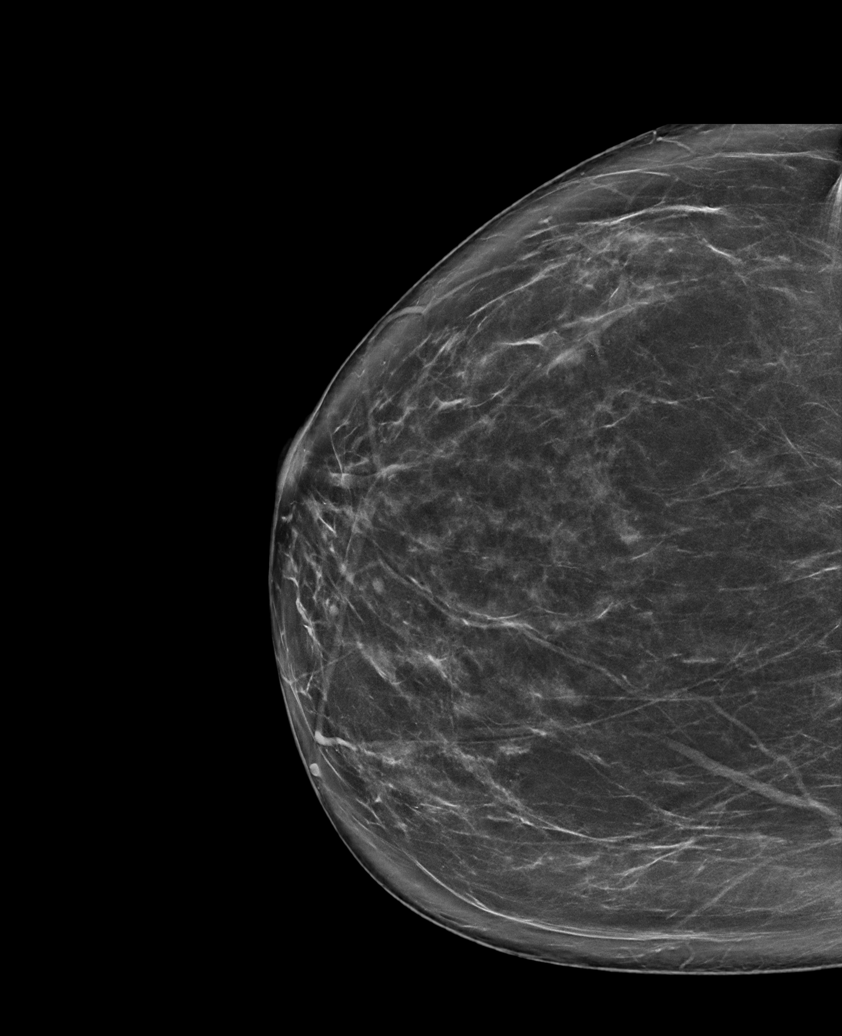

[R MLO synth-2D]
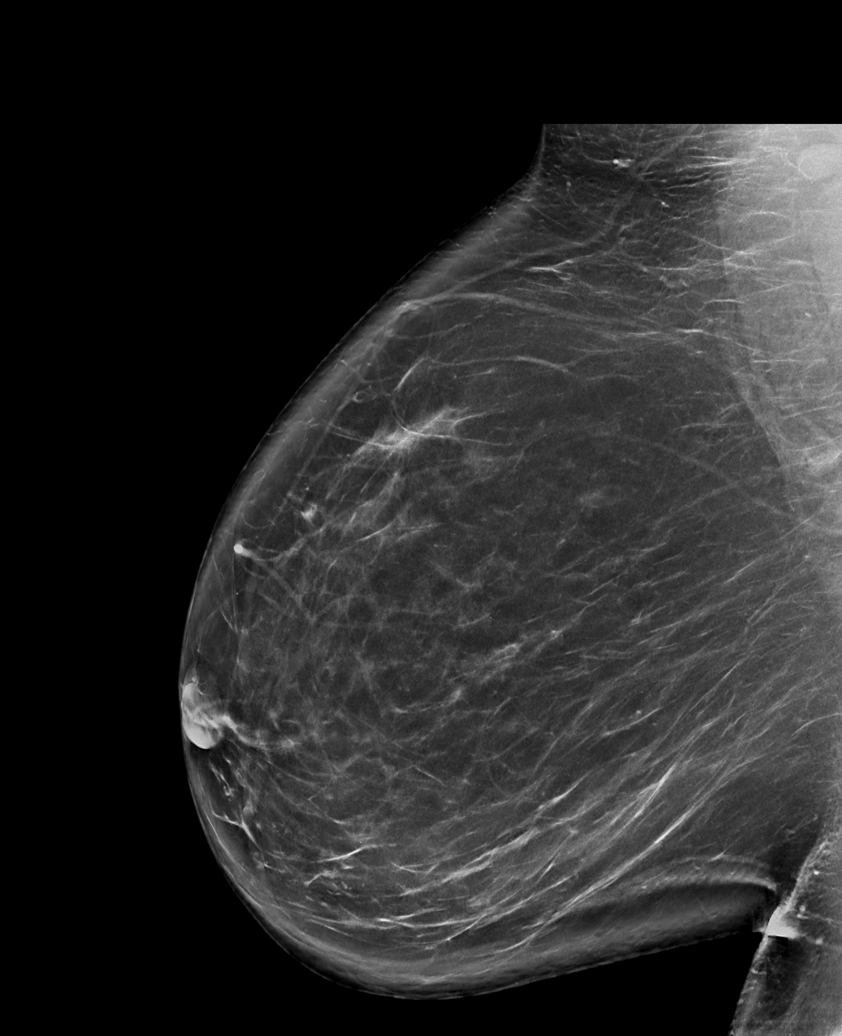

[L CC synth-2D]
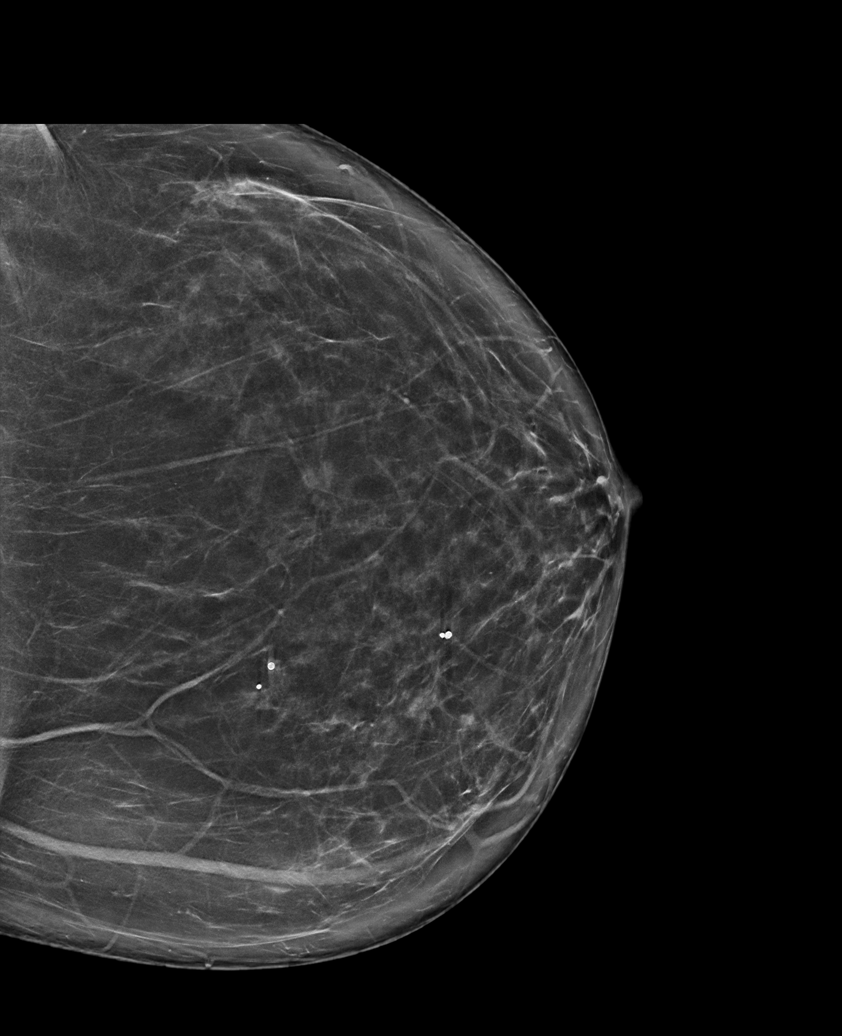

[R CC synth-2D (2 of 2)]
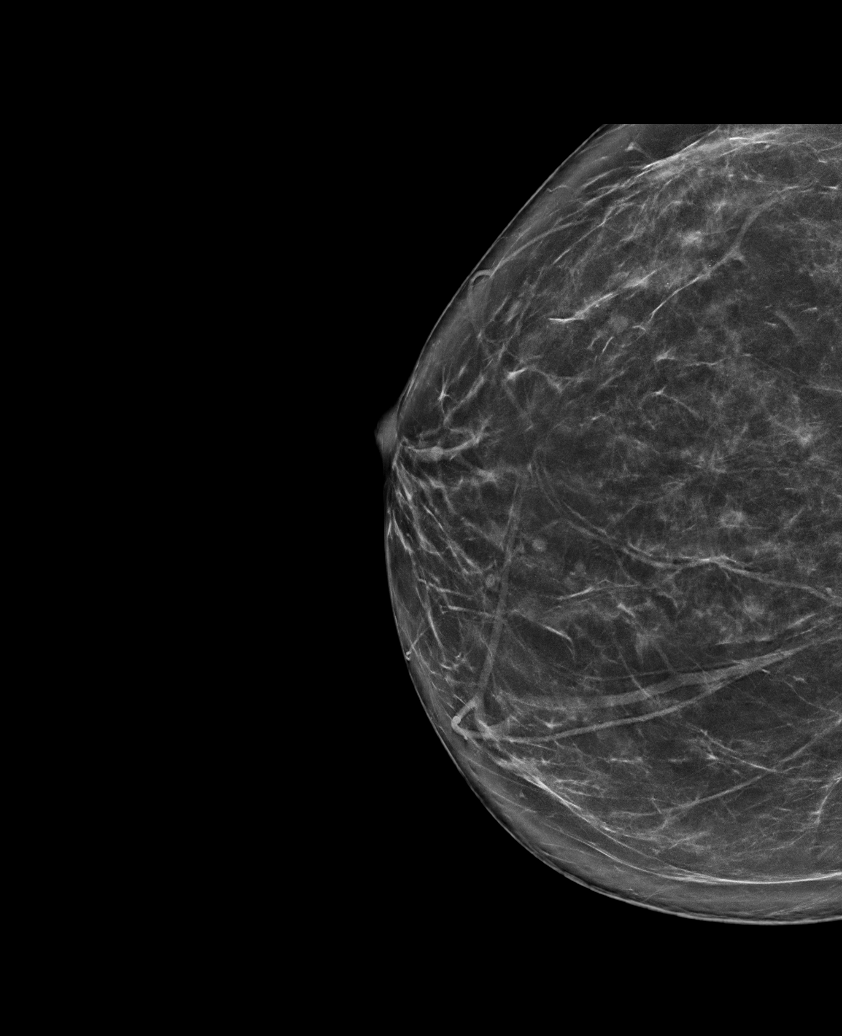

[L MLO synth-2D]
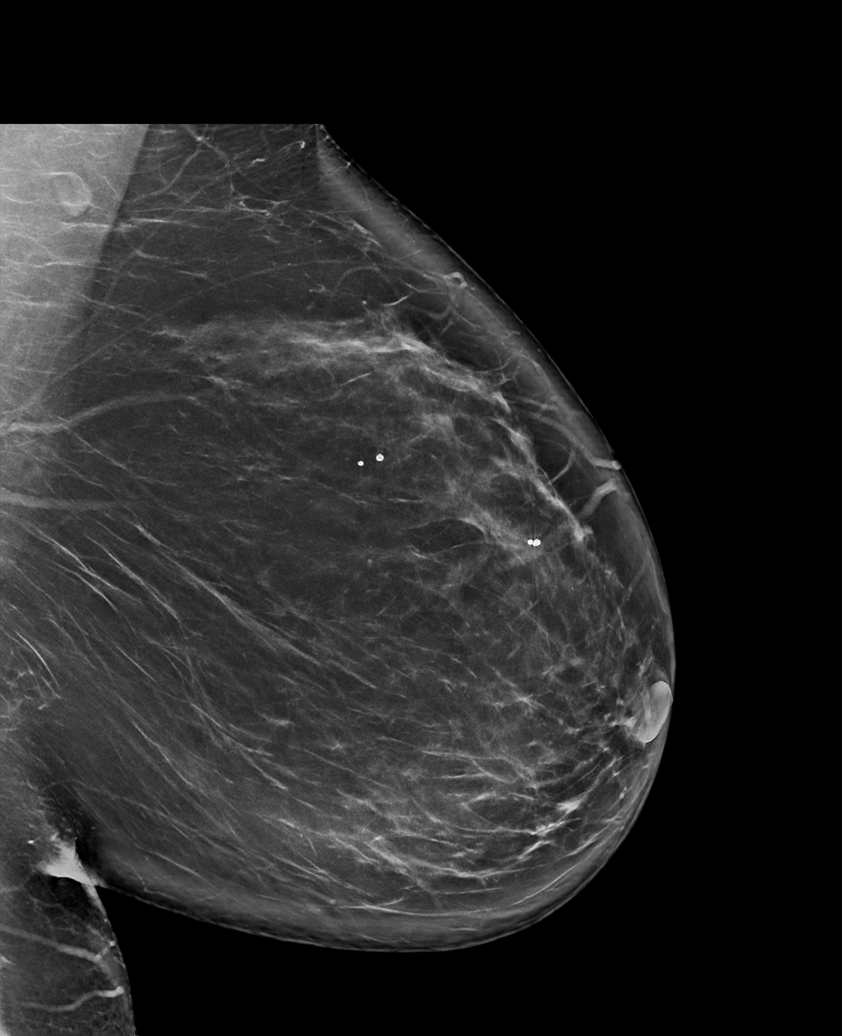

[L CC tomo · tomo slice 44/87.0]
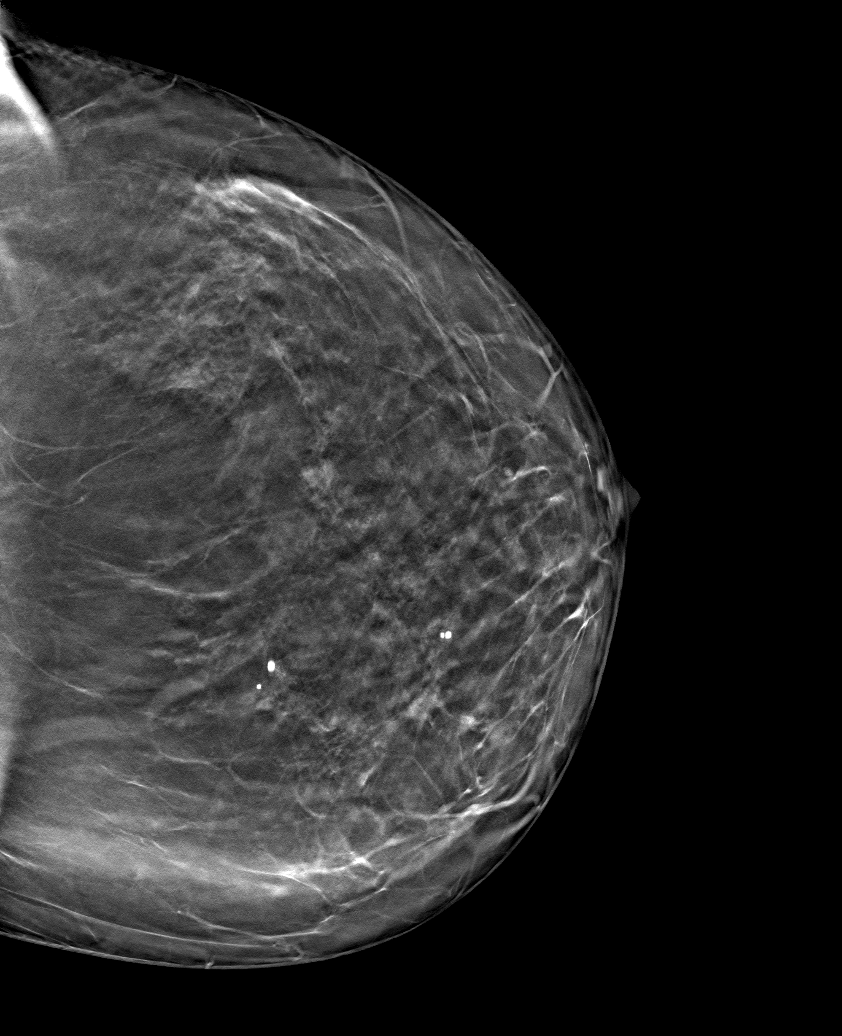

[6 of 30 positions shown; findings below may reference images not displayed]

ACR Breast Density Category b: There are scattered areas of
fibroglandular density.
FINDINGS: There are no findings suspicious for malignancy.
IMPRESSION: No mammographic evidence of malignancy. A result letter of this
screening mammogram will be mailed directly to the patient.

RECOMMENDATION:
Screening mammogram in one year. (Code:51-O-LD2)

BI-RADS CATEGORY  1: Negative.

## 2023-11-07 ENCOUNTER — Other Ambulatory Visit: Payer: Self-pay | Admitting: Family

## 2024-01-15 ENCOUNTER — Ambulatory Visit: Admitting: Family Medicine

## 2024-02-02 ENCOUNTER — Encounter: Payer: 59 | Admitting: Family Medicine

## 2024-03-12 ENCOUNTER — Encounter: Admitting: Family Medicine

## 2024-04-22 ENCOUNTER — Encounter: Admitting: Family Medicine
